# Patient Record
Sex: Male | Born: 1970
Health system: Southern US, Community
[De-identification: ages and names within clinical notes are randomized; demographics above are authoritative.]

## PROBLEM LIST (undated history)

## (undated) DIAGNOSIS — J45909 Unspecified asthma, uncomplicated: Secondary | ICD-10-CM

## (undated) DIAGNOSIS — M1990 Unspecified osteoarthritis, unspecified site: Secondary | ICD-10-CM

## (undated) DIAGNOSIS — I639 Cerebral infarction, unspecified: Secondary | ICD-10-CM

## (undated) HISTORY — DX: Cerebral infarction, unspecified: I63.9

## (undated) HISTORY — DX: Unspecified osteoarthritis, unspecified site: M19.90

## (undated) HISTORY — DX: Unspecified asthma, uncomplicated: J45.909

---

## 1999-01-31 ENCOUNTER — Emergency Department (HOSPITAL_COMMUNITY): Admission: EM | Admit: 1999-01-31 | Discharge: 1999-02-01 | Payer: Self-pay | Admitting: Emergency Medicine

## 1999-02-22 ENCOUNTER — Emergency Department (HOSPITAL_COMMUNITY): Admission: EM | Admit: 1999-02-22 | Discharge: 1999-02-22 | Payer: Self-pay | Admitting: Emergency Medicine

## 1999-07-12 ENCOUNTER — Emergency Department (HOSPITAL_COMMUNITY): Admission: EM | Admit: 1999-07-12 | Discharge: 1999-07-12 | Payer: Self-pay | Admitting: Emergency Medicine

## 1999-07-12 ENCOUNTER — Encounter: Payer: Self-pay | Admitting: Emergency Medicine

## 2000-04-30 ENCOUNTER — Emergency Department (HOSPITAL_COMMUNITY): Admission: EM | Admit: 2000-04-30 | Discharge: 2000-04-30 | Payer: Self-pay | Admitting: *Deleted

## 2000-07-12 ENCOUNTER — Emergency Department (HOSPITAL_COMMUNITY): Admission: EM | Admit: 2000-07-12 | Discharge: 2000-07-12 | Payer: Self-pay | Admitting: Emergency Medicine

## 2003-02-28 ENCOUNTER — Emergency Department (HOSPITAL_COMMUNITY): Admission: EM | Admit: 2003-02-28 | Discharge: 2003-02-28 | Payer: Self-pay | Admitting: Emergency Medicine

## 2004-11-09 ENCOUNTER — Emergency Department (HOSPITAL_COMMUNITY): Admission: EM | Admit: 2004-11-09 | Discharge: 2004-11-09 | Payer: Self-pay | Admitting: Emergency Medicine

## 2004-12-09 ENCOUNTER — Emergency Department (HOSPITAL_COMMUNITY): Admission: EM | Admit: 2004-12-09 | Discharge: 2004-12-10 | Payer: Self-pay | Admitting: Emergency Medicine

## 2005-06-04 ENCOUNTER — Emergency Department (HOSPITAL_COMMUNITY): Admission: EM | Admit: 2005-06-04 | Discharge: 2005-06-04 | Payer: Self-pay | Admitting: Emergency Medicine

## 2006-01-27 ENCOUNTER — Emergency Department (HOSPITAL_COMMUNITY): Admission: EM | Admit: 2006-01-27 | Discharge: 2006-01-27 | Payer: Self-pay | Admitting: Emergency Medicine

## 2009-03-30 ENCOUNTER — Emergency Department (HOSPITAL_COMMUNITY): Admission: EM | Admit: 2009-03-30 | Discharge: 2009-03-30 | Payer: Self-pay | Admitting: Emergency Medicine

## 2010-05-20 LAB — POCT I-STAT, CHEM 8
BUN: 16 mg/dL (ref 6–23)
Calcium, Ion: 1.19 mmol/L (ref 1.12–1.32)
Chloride: 108 mEq/L (ref 96–112)
Creatinine, Ser: 1.3 mg/dL (ref 0.4–1.5)
Glucose, Bld: 94 mg/dL (ref 70–99)
HCT: 44 % (ref 39.0–52.0)
Hemoglobin: 15 g/dL (ref 13.0–17.0)
Potassium: 3.9 mEq/L (ref 3.5–5.1)
Sodium: 140 mEq/L (ref 135–145)
TCO2: 25 mmol/L (ref 0–100)

## 2010-05-20 LAB — POCT CARDIAC MARKERS
CKMB, poc: <1 ng/mL — ABNORMAL LOW (ref 1.0–8.0)
Myoglobin, poc: 67.2 ng/mL (ref 12–200)
Troponin i, poc: <0.05 ng/mL (ref 0.00–0.09)

## 2013-06-17 ENCOUNTER — Emergency Department (HOSPITAL_BASED_OUTPATIENT_CLINIC_OR_DEPARTMENT_OTHER)
Admission: EM | Admit: 2013-06-17 | Discharge: 2013-06-17 | Disposition: A | Discharge status: Home / Self Care | Payer: PRIVATE HEALTH INSURANCE | Attending: Emergency Medicine | Admitting: Emergency Medicine

## 2013-06-17 ENCOUNTER — Encounter (HOSPITAL_BASED_OUTPATIENT_CLINIC_OR_DEPARTMENT_OTHER): Payer: Self-pay | Admitting: Emergency Medicine

## 2013-06-17 DIAGNOSIS — M542 Cervicalgia: Secondary | ICD-10-CM | POA: Insufficient documentation

## 2013-06-17 DIAGNOSIS — F17200 Nicotine dependence, unspecified, uncomplicated: Secondary | ICD-10-CM | POA: Insufficient documentation

## 2013-06-17 DIAGNOSIS — R209 Unspecified disturbances of skin sensation: Secondary | ICD-10-CM | POA: Insufficient documentation

## 2013-06-17 DIAGNOSIS — M545 Low back pain, unspecified: Secondary | ICD-10-CM | POA: Insufficient documentation

## 2013-06-17 DIAGNOSIS — M549 Dorsalgia, unspecified: Secondary | ICD-10-CM

## 2013-06-17 DIAGNOSIS — M6281 Muscle weakness (generalized): Secondary | ICD-10-CM | POA: Insufficient documentation

## 2013-06-17 MED ORDER — HYDROMORPHONE HCL PF 1 MG/ML IJ SOLN
1.0000 mg | Freq: Once | INTRAMUSCULAR | Status: AC
  Administered 2013-06-17: 1 mg via INTRAMUSCULAR
  Filled 2013-06-17: qty 1

## 2013-06-17 MED ORDER — HYDROCODONE-ACETAMINOPHEN 5-325 MG PO TABS: 1.0000 | ORAL_TABLET | Freq: Four times a day (QID) | ORAL | Status: DC | PRN

## 2013-06-17 MED ORDER — DIAZEPAM 5 MG PO TABS
5.0000 mg | ORAL_TABLET | Freq: Once | ORAL | Status: AC
  Administered 2013-06-17: 5 mg via ORAL
  Filled 2013-06-17: qty 1

## 2013-06-17 MED ORDER — KETOROLAC TROMETHAMINE 60 MG/2ML IM SOLN
60.0000 mg | Freq: Once | INTRAMUSCULAR | Status: AC
  Administered 2013-06-17: 60 mg via INTRAMUSCULAR
  Filled 2013-06-17: qty 2

## 2013-06-17 MED ORDER — DIAZEPAM 5 MG PO TABS: 5.0000 mg | ORAL_TABLET | Freq: Three times a day (TID) | ORAL | Status: DC | PRN

## 2013-06-17 NOTE — ED Notes (Signed)
Pt c/o lower back pain x 2 weeks 

## 2013-06-17 NOTE — ED Provider Notes (Signed)
CSN: [id number]     Arrival date & time 06/17/13  1750 History  This chart was scribed for Osvaldo Shipper, MD by Eston Mould, ED Scribe. This patient was seen in room MH01/MH01 and the patient's care was started at 6:17 PM.   Chief Complaint  Patient presents with  . Back Pain   Patient is a 43 y.o. male presenting with back pain. The history is provided by the patient. No language interpreter was used.  Back Pain Location:  Lumbar spine Quality:  Aching, stabbing and cramping Radiates to:  L posterior upper leg Pain severity:  Severe Pain is:  Same all the time Onset quality:  Gradual Duration:  2 weeks Timing:  Intermittent Progression:  Worsening Chronicity:  Recurrent Context: not falling, not lifting heavy objects, not MVA, not occupational injury, not physical stress, not recent illness, not recent injury and not twisting   Relieved by:  Nothing Worsened by:  Nothing tried Associated symptoms: numbness, tingling and weakness (to bilateral hands)   Associated symptoms: no abdominal swelling, no bladder incontinence, no bowel incontinence, no dysuria, no leg pain and no pelvic pain   Risk factors: no lack of exercise    HPI Comments: RYUN VELEZ is a 43 y.o. male with a hx of back pain and Bells palsy who presents to the Emergency Department complaining of ongoing worsening lower back pain x 2 weeks that radiates up his neck with episodes of numbness and tingling. Pt states 2 weeks ago he was having L sided numbness and tingling that was "up and down" bilateral legs and would radiate to his back. He states he began having back spasms that began while at work last week. Pt denies heavy lifting while at work when spasms began. He states his back pain is still present although it is intermittent. He states he woke up with a crick  in his neck this morning but also states for the past 2 weeks, he has had limited neck ROM. He states sitting and resting helps the pain  some but reports having pain returns shortly after standing up. He reports having loss of strength and sensation to his bilateral hands since onset back pain. He was diagnosed with Bells palsy over 10 years ago and states he was unable to move his body and had facial weakness at that point. Hx of back pain due to playing basketball when he was younger. 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  History reviewed. No pertinent past medical history. History reviewed. No pertinent past surgical history. History reviewed. No pertinent family history. History  Substance Use Topics  . Smoking status: Current Every Day Smoker -- 0.50 packs/day    Types: Cigarettes  . Smokeless tobacco: Not on file  . Alcohol Use: No    Review of Systems  Gastrointestinal: Negative for bowel incontinence.  Genitourinary: Negative for bladder incontinence, dysuria and pelvic pain.  Musculoskeletal: Positive for back pain and neck pain. Negative for neck stiffness.  Neurological: Positive for tingling, weakness (to bilateral hands) and numbness.  All other systems reviewed and are negative.  Allergies  Review of patient's allergies indicates no known allergies.  Home Medications   Prior to Admission medications   Not on File   Triage Vitals:BP 112/75  Pulse 80  Temp(Src) 98 F (36.7 C) (Oral)  Resp 18  Ht 6' (1.829 m)  Wt 185 lb (83.915 kg)  BMI 25.08 kg/m2  SpO2 98%  Physical Exam  Nursing note and vitals reviewed. Constitutional: He is oriented to person, place, and time. He appears well-developed and well-nourished. No distress.  HENT:  Head: Normocephalic and atraumatic.  Eyes: EOM are normal.  Neck: Neck supple. No tracheal deviation present.  Cardiovascular: Normal rate.   Pulmonary/Chest: Effort normal. No respiratory distress.  Abdominal: He exhibits no mass. There is no  rebound and no guarding.  Musculoskeletal: Normal range of motion.  L muscular back tenderness. No bony tenderness.   Neurological: He is alert and oriented to person, place, and time. A sensory deficit (mild altered light touch on lower left leg, left arm) is present. He exhibits normal muscle tone. Coordination normal. GCS eye subscore is 4. GCS verbal subscore is 5. GCS motor subscore is 6.  Skin: Skin is warm and dry.  Psychiatric: He has a normal mood and affect. His behavior is normal.   ED Course  Procedures DIAGNOSTIC STUDIES: Oxygen Saturation is 98% on RA, normal by my interpretation.    COORDINATION OF CARE: 6:28 PM-Discussed treatment plan which includes shot of Dilaudid and Vallum  while in the ED and monitor pt. Pt agreed to plan.   7:45 PM-Re-checked pt.   Labs Review Labs Reviewed - No data to display  Imaging Review No results found.   EKG Interpretation None     MDM   Final diagnoses:  Back pain    19M here with back pain. Numbness and tingling radiating down L leg and occasionally L arm. No fever, vomiting, incontinence/retention. No known injury. Hx of similar symptoms once before. All began with spasms in his back. Vitals stable. L paraspinal musculature with tenderness on palpation on exam. No bony spinal tenderness. Mild altered light touch on peripheral L sided extremities. No dermatomal distribution of numbness, no concern for stroke. Chronicity of symptoms and relation to muscle spasm sensation make vascular cause unlikely. Symptoms and physical exam c/w muscle spasm in the back. Pain meds given with no relief. Toradol given after recheck with great improvement. Patient ready for discharge, up getting dressed. Stable for discharge.  I personally performed the services described in this documentation, which was scribed in my presence. The recorded information has been reviewed and is accurate.     Osvaldo Shipper, MD 06/17/13 [phone]

## 2013-06-17 NOTE — Discharge Instructions (Signed)
Back Exercises °Back exercises help treat and prevent back injuries. The goal of back exercises is to increase the strength of your abdominal and back muscles and the flexibility of your back. These exercises should be started when you no longer have back pain. Back exercises include: °· Pelvic Tilt. Lie on your back with your knees bent. Tilt your pelvis until the lower part of your back is against the floor. Hold this position 5 to 10 sec and repeat 5 to 10 times. °· Knee to Chest. Pull first 1 knee up against your chest and hold for 20 to 30 seconds, repeat this with the other knee, and then both knees. This may be done with the other leg straight or bent, whichever feels better. °· Sit-Ups or Curl-Ups. Bend your knees 90 degrees. Start with tilting your pelvis, and do a partial, slow sit-up, lifting your trunk only 30 to 45 degrees off the floor. Take at least 2 to 3 seconds for each sit-up. Do not do sit-ups with your knees out straight. If partial sit-ups are difficult, simply do the above but with only tightening your abdominal muscles and holding it as directed. °· Hip-Lift. Lie on your back with your knees flexed 90 degrees. Push down with your feet and shoulders as you raise your hips a couple inches off the floor; hold for 10 seconds, repeat 5 to 10 times. °· Back arches. Lie on your stomach, propping yourself up on bent elbows. Slowly press on your hands, causing an arch in your low back. Repeat 3 to 5 times. Any initial stiffness and discomfort should lessen with repetition over time. °· Shoulder-Lifts. Lie face down with arms beside your body. Keep hips and torso pressed to floor as you slowly lift your head and shoulders off the floor. °Do not overdo your exercises, especially in the beginning. Exercises may cause you some mild back discomfort which lasts for a few minutes; however, if the pain is more severe, or lasts for more than 15 minutes, do not continue exercises until you see your caregiver.  Improvement with exercise therapy for back problems is slow.  °See your caregivers for assistance with developing a proper back exercise program. °Document Released: 03/28/2004 Document Revised: 05/13/2011 Document Reviewed: 12/20/2010 °ExitCare® Patient Information ©2014 [employer]. ° °Back Pain, Adult °Low back pain is very common. About 1 in 5 people have back pain. The cause of low back pain is rarely dangerous. The pain often gets better over time. About half of people with a sudden onset of back pain feel better in just 2 weeks. About 8 in 10 people feel better by 6 weeks.  °CAUSES °Some common causes of back pain include: °· Strain of the muscles or ligaments supporting the spine. °· Wear and tear (degeneration) of the spinal discs. °· Arthritis. °· Direct injury to the back. °DIAGNOSIS °Most of the time, the direct cause of low back pain is not known. However, back pain can be treated effectively even when the exact cause of the pain is unknown. Answering your caregiver's questions about your overall health and symptoms is one of the most accurate ways to make sure the cause of your pain is not dangerous. If your caregiver needs more information, he or she may order lab work or imaging tests (X-rays or MRIs). However, even if imaging tests show changes in your back, this usually does not require surgery. °HOME CARE INSTRUCTIONS °For many people, back pain returns. Since low back pain is rarely dangerous, it is often a condition that people   can learn to [facility] their own.   Remain active. It is stressful on the back to sit or stand in one place. Do not sit, drive, or stand in one place for more than 30 minutes at a time. Take short walks on level surfaces as soon as pain allows.Try to increase the length of time you walk each day.  Do not stay in bed.Resting more than 1 or 2 days can delay your recovery.  Do not avoid exercise or work.Your body is made to move.It is not dangerous to be active,  even though your back may hurt.Your back will likely heal faster if you return to being active before your pain is gone.  Pay attention to your body when you bend and lift. Many people have less discomfortwhen lifting if they bend their knees, keep the load close to their bodies,and avoid twisting. Often, the most comfortable positions are those that put less stress on your recovering back.  Find a comfortable position to sleep. Use a firm mattress and lie on your side with your knees slightly bent. If you lie on your back, put a pillow under your knees.  Only take over-the-counter or prescription medicines as directed by your caregiver. Over-the-counter medicines to reduce pain and inflammation are often the most helpful.Your caregiver may prescribe muscle relaxant drugs.These medicines help dull your pain so you can more quickly return to your normal activities and healthy exercise.  Put ice on the injured area.  Put ice in a plastic bag.  Place a towel between your skin and the bag.  Leave the ice on for 15-20 minutes, 03-04 times a day for the first 2 to 3 days. After that, ice and heat may be alternated to reduce pain and spasms.  Ask your caregiver about trying back exercises and gentle massage. This may be of some benefit.  Avoid feeling anxious or stressed.Stress increases muscle tension and can worsen back pain.It is important to recognize when you are anxious or stressed and learn ways to manage it.Exercise is a great option. SEEK MEDICAL CARE IF:  You have pain that is not relieved with rest or medicine.  You have pain that does not improve in 1 week.  You have new symptoms.  You are generally not feeling well. SEEK IMMEDIATE MEDICAL CARE IF:   You have pain that radiates from your back into your legs.  You develop new bowel or bladder control problems.  You have unusual weakness or numbness in your arms or legs.  You develop nausea or vomiting.  You develop  abdominal pain.  You feel faint. Document Released: 02/18/2005 Document Revised: 08/20/2011 Document Reviewed: 07/09/2010 [facility] Patient Information 2014 [place], [place].   Emergency Department Resource Guide 1) Find a Doctor and Pay Out of Pocket Although you won't have to find out who is covered by your insurance plan, it is a good idea to ask around and get recommendations. You will then need to call the office and see if the doctor you have chosen will accept you as a new patient and what types of options they offer for patients who are self-pay. Some doctors offer discounts or will set up payment plans for their patients who do not have insurance, but you will need to ask so you aren't surprised when you get to your appointment.  2) Contact Your Local Health Department Not all health departments have doctors that can see patients for sick visits, but many do, so it is worth a call to  see if yours does. If you don't know where your local health department is, you can check in your phone book. The CDC also has a tool to help you locate your state's health department, and many state websites also have listings of all of their local health departments.  3) Find a [facility] If your illness is not likely to be very severe or complicated, you may want to try a walk in clinic. These are popping up all over the country in pharmacies, drugstores, and shopping centers. They're usually staffed by nurse practitioners or physician assistants that have been trained to treat common illnesses and complaints. They're usually fairly quick and inexpensive. However, if you have serious medical issues or chronic medical problems, these are probably not your best option.  No Primary Care Doctor: - Call Health Connect at  [phone] - they can help you locate a primary care doctor that  accepts your insurance, provides certain services, etc. - Physician Referral Service- [phone]  Chronic Pain  Problems: Organization         Address  Phone   Notes  [facility]  [phone] Patients need to be referred by their primary care doctor.   Medication Assistance: Organization         Address  Phone   Notes  [facility] Medication [facility] [address] [place], [place] [address] [phone] --Must be a resident of [facility] -- Must have NO insurance coverage whatsoever (no Medicaid/ Medicare, etc.) -- The pt. MUST have a primary care doctor that directs their care regularly and follows them in the community   MedAssist  [phone]   [employer]  9186419916    Agencies that provide inexpensive medical care: Organization         Address  Phone   Notes  [place]  [phone]   [facility]    [phone]   [facility] [place], [place] [address] [phone]   [place]crete [address], [place] [phone]   Planned Parenthood    [phone]   [facility]    [phone]   [place] [address], [place] Phone:  [phone], Fax:  [phone] Hours of Operation:  9 am - 6 pm, M-F.  Also accepts Medicaid/Medicare and self-pay.  [facility] for [place] [address], [address], [place] Phone: [phone], Fax: [phone]. Hours of Operation:  8:30 am - 5:30 pm, M-F.  Also accepts Medicaid and self-pay.  [facility] [place] [address], [place] Phone: [phone]   [place], [place], [place] [phone], Ext. [phone] Mondays & Thursdays: 7-9 AM.  First 15 patients are seen on a first come, first serve basis.    [place] Providers:  Organization         Address  Phone   Notes  [facility] [address], [place] [phone] Also  accepts self-pay patients.  [place], [place]  [phone]   [place], [address], [place] [phone]   [place] [address] [place] [phone]   Lucianne Lei [address], [place]   [phone] Only accepts [place] Access Medicaid patients after they have their name applied to their card.   Self-Pay (no insurance) in [facility] [place]:  Organization         Address  Phone   Notes  Sickle Cell Patients, [facility] [address], [place] [phone]) [phone]   [facility] [address], [place] [phone]   [facility] [place]  [address], [place] [phone]   [facility]/Dr. Osei-Bonsu  [address] [place] [address], [place] or [phone], [place] [phone]) [phone] Phone number for both [place] and [place] locations is the same.  [facility] [address], [place] [phone]) [phone]   [facility] [place] [address] [place] [address], [place] or [address] [phone]) [phone]   [facility] [address], [place] [phone]) [phone], phone; [phone]) [phone], fax Sees patients 1st and 3rd Saturday of every month.  Must not qualify for public or private insurance (i.e. Medicaid, Medicare, [place] Health Choice, Veterans' Benefits)  Household income should be no more than 200% of the poverty level The clinic cannot treat you if you are pregnant or think you are pregnant  Sexually transmitted diseases are not treated at the clinic.    Dental Care: Organization         Address  Phone  Notes  [facility] [address], [place] [phone]) [phone] Accepts children up to age 421 who are enrolled in [place] or [place] Health Choice; pregnant  women with a Medicaid card; and children who have applied for Medicaid or [place] Health Choice, but were declined, whose parents can pay a reduced fee at time of service.  [facility] of [facility] [place]  [address], [place] [phone]) [phone] Accepts children up to age 41 who are enrolled in [place] or [place] Health Choice; pregnant women with a Medicaid card; and children who have applied for Medicaid or [place] Health Choice, but were declined, whose parents can pay a reduced fee at time of service.  [facility]  [address], [place] [phone]) [phone] Patients are seen by appointment only. Walk-ins are not accepted. [facility] will see patients 41 years of age and older. Monday - Tuesday (8am-5pm) Most Wednesdays (8:30-5pm) $30 per visit, cash only  [facility]  [address], [place] [phone] Patients are seen by appointment only. Walk-ins are not accepted. [facility] will see patients 41 years of age and older. One Wednesday Evening (Monthly: Volunteer Based).  $30 per visit, cash only  [employer] [facility]  [phone]) [phone] for adults; Children under age 224, call Graduate Pediatric Dentistry at [phone]. Children aged 134-14, please call [phone] to request a pediatric application.  Dental services are provided in all areas of dental care including fillings, crowns and bridges, complete and partial dentures, implants, gum treatment, root canals, and extractions. Preventive care is also provided. Treatment is provided to both adults and children. Patients are selected via a lottery and there is often a waiting list.   [facility] [address], [place]  [phone] [email or web address]   [facility] [address], [place], [place] [phone], Ext. [phone] Second and Fourth Thursday of each month, opens at 6:30 AM;  Clinic ends at 9 AM.  Patients are  seen on a first-come first-served basis, and a limited number are seen during each clinic.   [facility]  [address] Hillard Danker [place], [place] [phone]   Eligibility Requirements You must have lived in [place], [place], or [place] counties for at least the last three months.   You cannot be eligible for state or federal sponsored [employer], including [employer], [place], or [employer].   You generally cannot be eligible for healthcare insurance through your employer.    How to apply: Eligibility screenings are held every Tuesday and Wednesday afternoon from 1:00 pm until 4:00 pm. You do not need an appointment for the interview!  [facility] [address] [place] [address], [place], [place]   [place]  [place] Department  [place]  [phone]    Behavioral Health Resources in the Community: Intensive Outpatient Programs Organization         Address  Phone  Notes  [place]. [address], [place], [place] [phone]   [facility]patient [address], [place], [place]   ADS: Alcohol & Drug Svcs [address], [place], [place]   [place] [address]  [place], [place] or [phone]   Substance Abuse Resources Organization         Address  Phone  Notes  Alcohol and Drug Services  6237738663   [place]ian  [phone]   The [place]   Chinita Pester  [phone]   Residential & Outpatient Substance Abuse Program  [phone]   Psychological Services Organization         Address  Phone  Notes  [facility] [place]  [place]  [phone]   [place] [address] [place] or [phone]    Mobile Crisis  Teams Organization         Address  Phone  Notes  [facility], Mobile Crisis Care Unit  [phone]   [facility]ive Psychotherapeutic Services  [address] [place], [place]   [place] Levels [address] [place] [phone]    Self-Help/Support Groups Organization         Address  Phone             Notes  [place]. of [place] - variety of support groups  [place] Call for more information  Narcotics Anonymous (NA), [facility] [address], [employer] [place]  2 meetings at this location   Special educational needs teacher         Address  Phone  Notes  [facility]AP Residential Treatment [place],    [place]  [phone]   [facility]  [address] [place] [address], [place], [place]   [place], [place] [phone] Admissions: 8am-3pm M-F  Incentives Substance [place] [address]    [place], [place] [phone]   [facility] [address] Jadene Pierini [place], [place]   [facility] [address]  [place], [place]   [facility] Programs - Intensive Outpatient [address] Kristeen [address], [place], [place]   [facility] ([place] [address] [place], [place] or [phone]  [facility]idential [facility] Services (RTS) [address] [place], [place] [phone] Accepts Medicaid  [facility] [address]  [place] [phone] Substance Abuse/Addiction Treatment   [facility] Behavioral Health Resources Organization         Address  Phone  Notes  [facility]  [phone]) [phone]   Angie FavaJulie Brannon, PhD [address], [address] [place], [place]   [phone] or [phone]) [phone]   [facility]   [address] [place], [place] [phone](336) [phone]   [facility] [address], [place], [place] [phone]) [phone]  Insurance/Medicaid/sponsorship through [facility]  Faith and Families [address]                                    [place], [place] [phone]) [phone] Therapy/tele-psych/case  [facility] [address].   [place], [place] [phone]) [phone]    Dr. Lolly MustacheArfeen  [phone](336) [phone]   [facility]  [employer] [facility] 1) [address], [place] 2) [address], [place] 3)  [address], [place] [phone]) [phone]) [phone]  [phone]) [phone]   [facility] Child Abuse Hotline [phone]) [phone] or [phone]) [phone] (After Hours)

## 2015-02-21 ENCOUNTER — Encounter (HOSPITAL_BASED_OUTPATIENT_CLINIC_OR_DEPARTMENT_OTHER): Payer: Self-pay | Admitting: *Deleted

## 2015-02-21 ENCOUNTER — Emergency Department (HOSPITAL_BASED_OUTPATIENT_CLINIC_OR_DEPARTMENT_OTHER): Payer: 59

## 2015-02-21 ENCOUNTER — Emergency Department (HOSPITAL_BASED_OUTPATIENT_CLINIC_OR_DEPARTMENT_OTHER)
Admission: EM | Admit: 2015-02-21 | Discharge: 2015-02-21 | Disposition: A | Discharge status: Home / Self Care | Payer: 59 | Attending: Emergency Medicine | Admitting: Emergency Medicine

## 2015-02-21 DIAGNOSIS — R51 Headache: Secondary | ICD-10-CM | POA: Insufficient documentation

## 2015-02-21 DIAGNOSIS — F17210 Nicotine dependence, cigarettes, uncomplicated: Secondary | ICD-10-CM | POA: Diagnosis not present

## 2015-02-21 DIAGNOSIS — M542 Cervicalgia: Secondary | ICD-10-CM | POA: Insufficient documentation

## 2015-02-21 DIAGNOSIS — M546 Pain in thoracic spine: Secondary | ICD-10-CM | POA: Diagnosis not present

## 2015-02-21 MED ORDER — CYCLOBENZAPRINE HCL 10 MG PO TABS: 10.0000 mg | ORAL_TABLET | Freq: Three times a day (TID) | ORAL | Status: DC | PRN

## 2015-02-21 MED ORDER — KETOROLAC TROMETHAMINE 60 MG/2ML IM SOLN
60.0000 mg | Freq: Once | INTRAMUSCULAR | Status: AC
  Administered 2015-02-21: 60 mg via INTRAMUSCULAR
  Filled 2015-02-21: qty 2

## 2015-02-21 MED ORDER — NAPROXEN 500 MG PO TABS: 500.0000 mg | ORAL_TABLET | Freq: Two times a day (BID) | ORAL | Status: DC

## 2015-02-21 NOTE — ED Notes (Addendum)
Left shoulder, arm, leg and head pain for 6 months to a year. He was diagnosed with nerve pain and given a steroid injection in his neck April 2015.

## 2015-02-21 NOTE — ED Notes (Signed)
Patient transported to radiology department via stretcher.

## 2015-02-21 NOTE — ED Provider Notes (Signed)
CSN: [id number]     Arrival date & time 02/21/15  1515 History   First MD Initiated Contact with Patient 02/21/15 1541     Chief Complaint  Patient presents with  . Muscle Pain     (Consider location/radiation/quality/duration/timing/severity/associated sxs/prior Treatment) HPI  44 year old male presents with a worsening headache over the past 1 week. Patient states that in April 2015 he had similar symptoms and was given a localize shot in his left neck/head that seem to make his symptoms go away. However he is still had left-sided back and neck pain as well as pain over his trapezius. This has been going on for "months" but also is worse with the headache being worse. There was no sudden onset of pain. No fevers. He feels like his arm and leg are weak although he later explains this is hard to move due to pain. No numbness. No direct injury or trauma. Patient has not taken anything for the pain. He states the pain is "200 times worse" than when he was here in April. He specifically does not want another shot like the one he had. Patient has not followed up with a PCP. No bowel/bladder incontinence. Ever having is worse with range of motion of his neck or movement of the left side of his body.  History reviewed. No pertinent past medical history. History reviewed. No pertinent past surgical history. No family history on file. Social History  Substance Use Topics  . Smoking status: Current Every Day Smoker -- 0.50 packs/day    Types: Cigarettes  . Smokeless tobacco: None  . Alcohol Use: No    Review of Systems  Constitutional: Negative for fever.  Gastrointestinal: Negative for vomiting.  Musculoskeletal: Positive for myalgias and neck pain.  Neurological: Positive for headaches. Negative for weakness.  All other systems reviewed and are negative.     Allergies  Review of patient's allergies indicates no known allergies.  Home Medications   Prior to Admission medications     Medication Sig Start Date End Date Taking? Authorizing Provider  diazepam (VALIUM) 5 MG tablet Take 1 tablet (5 mg total) by mouth every 8 (eight) hours as needed for muscle spasms. 06/17/13   Evelina Bucy, MD  HYDROcodone-acetaminophen (NORCO/VICODIN) 5-325 MG per tablet Take 1 tablet by mouth every 6 (six) hours as needed for moderate pain. 06/17/13   Evelina Bucy, MD   BP 124/93 mmHg  Pulse 81  Temp(Src) 98.3 F (36.8 C) (Oral)  Resp 20  Ht 5\' 11"  (1.803 m)  Wt 185 lb (83.915 kg)  BMI 25.81 kg/m2  SpO2 100% Physical Exam  Constitutional: He is oriented to person, place, and time. He appears well-developed and well-nourished.  HENT:  Head: Normocephalic and atraumatic.  Right Ear: External ear normal.  Left Ear: External ear normal.  Nose: Nose normal.  Eyes: EOM are normal. Right eye exhibits no discharge. Left eye exhibits no discharge.  Neck: Neck supple. Muscular tenderness present. No spinous process tenderness present.    Cardiovascular: Normal rate, regular rhythm, normal heart sounds and intact distal pulses.   Pulmonary/Chest: Effort normal and breath sounds normal.  Abdominal: Soft. There is no tenderness.  Musculoskeletal: He exhibits no edema.       Cervical back: He exhibits tenderness.       Back:  Neurological: He is alert and oriented to person, place, and time.  CN 2-12 grossly intact. 5/5 strength in all 4 extremities. Grossly normal sensation.  Skin: Skin is warm and dry.  Nursing note and vitals reviewed.   ED Course  Procedures (including critical care time) Labs Review Labs Reviewed - No data to display  Imaging Review Dg Cervical Spine Complete  02/21/2015  CLINICAL DATA:  Cervical pain radiating from occipital region down to right upper extremity with numbness several years. Pain worse today. EXAM: CERVICAL SPINE - COMPLETE 4+ VIEW COMPARISON:  05/10/2014 FINDINGS: Vertebral body alignment, heights and disc space heights are within normal. There  is mild spondylosis of the cervical spine. Prevertebral soft tissues as well as the atlantoaxial articulation are normal. There is minimal right-sided neural foramina narrowing at the C3-4 level slightly worse. No left-sided neural foraminal narrowing. Mild uncovertebral joint spurring. IMPRESSION: Mild spondylosis of the cervical spine. Minimal right-sided neural foraminal narrowing at the C3-4 level slightly worse. Electronically Signed   By: Marin Olp M.D.   On: 02/21/2015 17:02   I have personally reviewed and evaluated these images and lab results as part of my medical decision-making.   EKG Interpretation None      MDM   Final diagnoses:  Neck pain on left side  Left-sided thoracic back pain    Patient's headache seems to be coming from his neck and back pain. I believe these are from a muscle spasm. Will treat with Flexeril and NSAIDs. No alarming findings on history or physical. Very low suspicion for significant pathology intracranially do not feel CT or lumbar fracture would be indicated. This appears to be mostly an MSK issue. No weakness or numbness on exam. Will refer to a PCP, orthopedist, and discussed return precautions.    Sherwood Gambler, MD 02/21/15 [phone]

## 2015-03-02 ENCOUNTER — Ambulatory Visit (INDEPENDENT_AMBULATORY_CARE_PROVIDER_SITE_OTHER): Payer: 59 | Admitting: Family Medicine

## 2015-03-02 ENCOUNTER — Encounter: Payer: Self-pay | Admitting: Family Medicine

## 2015-03-02 VITALS — BP 142/100 | HR 92 | Ht 72.0 in | Wt 185.0 lb

## 2015-03-02 DIAGNOSIS — R52 Pain, unspecified: Secondary | ICD-10-CM

## 2015-03-02 MED ORDER — PREDNISONE 10 MG PO TABS
ORAL_TABLET | ORAL | Status: DC
Start: 2015-03-02 — End: 2015-07-05

## 2015-03-02 NOTE — Patient Instructions (Signed)
There are three possibilities here: irritated spinal cord at the level of the neck, diffuse muscle spasms, or a neurologic issue. Start with prednisone dose pack x 6 days - take fully until this is gone. Call us when you know what the muscle relaxant is you were talking about. When you get the insurance cards bring these in so we can order physical therapy. Follow up with me 4 weeks after starting physical therapy - if not improving would consider an MRI of your cervical spine or neurology referral.

## 2015-03-03 DIAGNOSIS — R52 Pain, unspecified: Secondary | ICD-10-CM | POA: Insufficient documentation

## 2015-03-03 NOTE — Progress Notes (Signed)
PCP: No PCP Per Patient  Subjective:   HPI: Patient is a 44 y.o. male here for left side pain.  Patient reports current issues with left side started 06/02/2013 - was having low back pain radiating to neck with numbness and tingling predominantly on left side. Had a local injection (toradol per ED) that helped for about a month but has been struggling with pain since then. Pain level is 10/10 and throbbing mainly in neck now. Worse with neck motions. He states he has also had a cold chill, blurred vision when this started and could barely move left arm and leg. Left side feels weak as well both in arm and leg. Numbness into left foot currently. Taking flexeril, naproxen. No skin changes, fever.  No past medical history on file.  Current Outpatient Prescriptions on File Prior to Visit  Medication Sig Dispense Refill  . cyclobenzaprine (FLEXERIL) 10 MG tablet Take 1 tablet (10 mg total) by mouth 3 (three) times daily as needed for muscle spasms. 20 tablet 0  . HYDROcodone-acetaminophen (NORCO/VICODIN) 5-325 MG per tablet Take 1 tablet by mouth every 6 (six) hours as needed for moderate pain. 20 tablet 0  . naproxen (NAPROSYN) 500 MG tablet Take 1 tablet (500 mg total) by mouth 2 (two) times daily. 30 tablet 0   No current facility-administered medications on file prior to visit.    No past surgical history on file.  No Known Allergies  Social History   Social History  . Marital Status: Single    Spouse Name: N/A  . Number of Children: N/A  . Years of Education: N/A   Occupational History  . Not on file.   Social History Main Topics  . Smoking status: Current Every Day Smoker -- 0.50 packs/day    Types: Cigarettes  . Smokeless tobacco: Not on file  . Alcohol Use: No  . Drug Use: No  . Sexual Activity: No   Other Topics Concern  . Not on file   Social History Narrative    No family history on file.  BP 142/100 mmHg  Pulse 92  Ht 6' (1.829 m)  Wt 185 lb (83.915  kg)  BMI 25.08 kg/m2  Review of Systems: See HPI above.    Objective:  Physical Exam:  Gen: NAD  Neck: No gross deformity, swelling, bruising. TTP left cervical and thoracic paraspinal region.  No midline/bony TTP. FROM. BUE strength 5/5.   2+ equal reflexes in triceps, biceps, brachioradialis tendons. Negative spurlings. Decreased sensation entire left arm.  Back: No gross deformity, scoliosis. TTP .  No midline or bony TTP. FROM. Strength RLEs 5/5 all muscle groups, 4/5 LLE.   2+ MSRs in achilles tendons, trace patellar tendons, equal bilaterally. Negative SLRs. Sensation diminished LLE to light touch. Negative logroll bilateral hips Negative fabers and piriformis stretches.    Assessment & Plan:  1. Left side pain - Unusual presentation though has been present for over 1 1/2 years making intracranial abnormality less likely.  Irritation cervical spinal cord more on left side, diffuse muscle spasms possible as well.  Discussed options - will start prednisone dose pack, muscle relaxant.  Would like to add physical therapy.  Consider cervical spine MRI, neuro referral if not improving.

## 2015-03-03 NOTE — Assessment & Plan Note (Signed)
Unusual presentation though has been present for over 1 1/2 years making intracranial abnormality less likely.  Irritation cervical spinal cord more on left side, diffuse muscle spasms possible as well.  Discussed options - will start prednisone dose pack, muscle relaxant.  Would like to add physical therapy.  Consider cervical spine MRI, neuro referral if not improving.

## 2015-07-05 ENCOUNTER — Encounter (HOSPITAL_BASED_OUTPATIENT_CLINIC_OR_DEPARTMENT_OTHER): Payer: Self-pay

## 2015-07-05 ENCOUNTER — Emergency Department (HOSPITAL_BASED_OUTPATIENT_CLINIC_OR_DEPARTMENT_OTHER)
Admission: EM | Admit: 2015-07-05 | Discharge: 2015-07-05 | Disposition: A | Discharge status: Home / Self Care | Payer: 59 | Attending: Emergency Medicine | Admitting: Emergency Medicine

## 2015-07-05 DIAGNOSIS — M5416 Radiculopathy, lumbar region: Secondary | ICD-10-CM | POA: Insufficient documentation

## 2015-07-05 DIAGNOSIS — F17210 Nicotine dependence, cigarettes, uncomplicated: Secondary | ICD-10-CM | POA: Diagnosis not present

## 2015-07-05 DIAGNOSIS — M545 Low back pain: Secondary | ICD-10-CM | POA: Diagnosis present

## 2015-07-05 DIAGNOSIS — M5410 Radiculopathy, site unspecified: Secondary | ICD-10-CM

## 2015-07-05 MED ORDER — IBUPROFEN 800 MG PO TABS
800.0000 mg | ORAL_TABLET | Freq: Once | ORAL | Status: AC
Start: 2015-07-05 — End: 2015-07-05
  Administered 2015-07-05: 800 mg via ORAL
  Filled 2015-07-05: qty 1

## 2015-07-05 MED ORDER — METHOCARBAMOL 500 MG PO TABS: 500.0000 mg | ORAL_TABLET | Freq: Two times a day (BID) | ORAL | Status: DC

## 2015-07-05 MED ORDER — IBUPROFEN 800 MG PO TABS: 800.0000 mg | ORAL_TABLET | Freq: Three times a day (TID) | ORAL | Status: DC | PRN

## 2015-07-05 NOTE — Discharge Instructions (Signed)

## 2015-07-05 NOTE — ED Notes (Signed)
C/o lower back pain since moving a washer last week-slow steady gait-NAD

## 2015-07-05 NOTE — ED Provider Notes (Signed)
CSN: [id number]     Arrival date & time 07/05/15  1839 History   First MD Initiated Contact with Patient 07/05/15 2114     Chief Complaint  Patient presents with  . Back Pain     (Consider location/radiation/quality/duration/timing/severity/associated sxs/prior Treatment) HPI   45 year old male presents for evaluation of lower back pain.  Pt c/o progressive worsening lower back pain x 1 week.  Has been seen in the past for this chronic back pain.  Pain is lower radiates to neck, constant, worsening after trying to move a washer by himself.  Has tried exercise without relief.  He tried picking up fiberoptic cords today which worsening the pain.  Report difficulty walking in his leg and moving his neck.  Worsening pain with trying to bend down.  In the past he was prescribed naprosen which doesn't help.  Has had injection which doesn't help.  States he has been seen and management by orthopedist in the past but sts he doesn't receive a definitely diagnosis.  Denies fever, chills, weakness in extremities, bowel/bladder incontinence or saddle anesthesia.  NO hematuria or melena.  History reviewed. No pertinent past medical history. History reviewed. No pertinent past surgical history. No family history on file. Social History  Substance Use Topics  . Smoking status: Current Every Day Smoker -- 0.50 packs/day    Types: Cigarettes  . Smokeless tobacco: None  . Alcohol Use: Yes     Comment: weekly    Review of Systems  All other systems reviewed and are negative.     Allergies  Review of patient's allergies indicates no known allergies.  Home Medications   Prior to Admission medications   Not on File   BP 118/71 mmHg  Pulse 80  Temp(Src) 98.7 F (37.1 C) (Oral)  Ht 5\' 11"  (1.803 m)  Wt 83.915 kg  BMI 25.81 kg/m2  SpO2 100% Physical Exam  Constitutional: He is oriented to person, place, and time. He appears well-developed and well-nourished. No distress.  HENT:  Head:  Atraumatic.  Eyes: Conjunctivae are normal.  Neck: Neck supple.  Cardiovascular: Intact distal pulses.   Abdominal: Soft. There is no tenderness.  Musculoskeletal: He exhibits tenderness (diffused tenderness throughout the entire back with gentle palpation without focal point tenderness.  no overlying skin changes.). He exhibits no edema.  Poor effort but equal strength to all 4 extremities  Neurological: He is alert and oriented to person, place, and time. He has normal reflexes.  Skin: No rash noted.  Psychiatric: He has a normal mood and affect.  Nursing note and vitals reviewed.   ED Course  Procedures (including critical care time)   MDM   Final diagnoses:  Radicular low back pain    BP 118/71 mmHg  Pulse 80  Temp(Src) 98.7 F (37.1 C) (Oral)  Ht 5\' 11"  (1.803 m)  Wt 83.915 kg  BMI 25.81 kg/m2  SpO2 100%   Pt here with acute on chronic back pain with radiculopathy.  No specific trauma, low suspicion for fx/dislocation.  No red flags.  Will provide NSAIDs and muscle relaxant to use as needed.  F/u with ortho PRN.  Return precaution discussed.  Pt able to ambulate.   Domenic Moras, PA-C 07/05/15 [place], MD 07/07/15 [phone]

## 2016-02-08 ENCOUNTER — Encounter (HOSPITAL_BASED_OUTPATIENT_CLINIC_OR_DEPARTMENT_OTHER): Payer: Self-pay | Admitting: *Deleted

## 2016-02-08 ENCOUNTER — Emergency Department (HOSPITAL_BASED_OUTPATIENT_CLINIC_OR_DEPARTMENT_OTHER)
Admission: EM | Admit: 2016-02-08 | Discharge: 2016-02-09 | Discharge status: Against Medical Advice | Payer: 59 | Attending: Emergency Medicine | Admitting: Emergency Medicine

## 2016-02-08 DIAGNOSIS — R200 Anesthesia of skin: Secondary | ICD-10-CM

## 2016-02-08 DIAGNOSIS — R519 Headache, unspecified: Secondary | ICD-10-CM

## 2016-02-08 DIAGNOSIS — R531 Weakness: Secondary | ICD-10-CM | POA: Insufficient documentation

## 2016-02-08 DIAGNOSIS — R51 Headache: Secondary | ICD-10-CM | POA: Insufficient documentation

## 2016-02-08 DIAGNOSIS — F17210 Nicotine dependence, cigarettes, uncomplicated: Secondary | ICD-10-CM | POA: Insufficient documentation

## 2016-02-08 NOTE — ED Triage Notes (Signed)
Pt c/o sudden onset of left arm and leg numbness  X 12 hrs, denies slurred speech.

## 2016-02-09 MED ORDER — HYDROCODONE-ACETAMINOPHEN 5-325 MG PO TABS: 1.0000 | ORAL_TABLET | Freq: Four times a day (QID) | ORAL | 0 refills | Status: DC | PRN

## 2016-02-09 MED ORDER — HYDROCODONE-ACETAMINOPHEN 5-325 MG PO TABS
2.0000 | ORAL_TABLET | Freq: Once | ORAL | Status: AC
  Administered 2016-02-09: 2 via ORAL
  Filled 2016-02-09: qty 2

## 2016-02-09 NOTE — ED Provider Notes (Signed)
TIME SEEN: 12:20 AM  CHIEF COMPLAINT: Left-sided headache, left-sided numbness and weakness  HPI: Patient is a 45 year old male with history of tobacco use, family history of CAD and strokes who presents to the emergency department with complaints of left-sided posterior headache that radiates down his left neck that started around 11 AM yesterday morning. States at that time also had left-sided numbness and focal weakness in the arm and leg which seems to be improving. No changes in his vision or speech. No head injury. States he had similar symptoms in December and was given an injection in the posterior scalp which helped his pain. States he thinks that he had the neurologic deficits at that time but per the note patient was neurologically intact. He is not on anticoagulation or antiplatelets. No chest pain or shortness of breath. He denies a history of TIA, CVA, intracranial hemorrhage.  No fever, neck stiffness.  ROS: See HPI Constitutional: no fever  Eyes: no drainage  ENT: no runny nose   Cardiovascular:  no chest pain  Resp: no SOB  GI: no vomiting GU: no dysuria Integumentary: no rash  Allergy: no hives  Musculoskeletal: no leg swelling  Neurological: no slurred speech ROS otherwise negative  PAST MEDICAL HISTORY/PAST SURGICAL HISTORY:  History reviewed. No pertinent past medical history.  MEDICATIONS:  Prior to Admission medications   Not on File    ALLERGIES:  No Known Allergies  SOCIAL HISTORY:  Social History  Substance Use Topics  . Smoking status: Current Every Day Smoker    Packs/day: 0.50    Types: Cigarettes  . Smokeless tobacco: Not on file  . Alcohol use Yes     Comment: weekly    FAMILY HISTORY: History reviewed. No pertinent family history.  EXAM: BP 150/95   Pulse 81   Temp 98.2 F (36.8 C) (Oral)   Resp 18   Ht 6' (1.829 m)   Wt 185 lb (83.9 kg)   SpO2 100%   BMI 25.09 kg/m  CONSTITUTIONAL: Alert and oriented and responds appropriately  to questions. Well-appearing; well-nourished, Appears anxious and tearful HEAD: Normocephalic EYES: Conjunctivae clear, PERRL, EOMI ENT: normal nose; no rhinorrhea; moist mucous membranes NECK: Supple, no meningismus, no nuchal rigidity, no LAD, no carotid bruit appreciated, tender over the trapezius muscle and over the left occiput without swelling, erythema or warmth  CARD: RRR; S1 and S2 appreciated; no murmurs, no clicks, no rubs, no gallops RESP: Normal chest excursion without splinting or tachypnea; breath sounds clear and equal bilaterally; no wheezes, no rhonchi, no rales, no hypoxia or respiratory distress, speaking full sentences ABD/GI: Normal bowel sounds; non-distended; soft, non-tender, no rebound, no guarding, no peritoneal signs, no hepatosplenomegaly BACK:  The back appears normal and is non-tender to palpation, there is no CVA tenderness EXT: Normal ROM in all joints; non-tender to palpation; no edema; normal capillary refill; no cyanosis, no calf tenderness or swelling    SKIN: Normal color for age and race; warm; no rash NEURO: Moves all extremities equally but do seem to have some difficulty lifting the left leg, and reports decreased sensation in the left arm and leg but normal sensation in the face on the left side, normal sensation on the right side, decreased strength in the left upper and lower extremity 4-/5 compared to the right which is 5+/5. Normal gait. Normal speech. Cranial nerves II through XII intact. PSYCH: The patient's mood and manner are appropriate. Grooming and personal hygiene are appropriate.  MEDICAL DECISION MAKING: Patient here  with concerning symptoms for stroke. Has left arm and leg numbness and weakness. NIH stroke scale is a 2.  Symptoms started over 12 hours ago. He reports he feels like they are getting better. I have recommended labs, EKG and a head CT. Patient reports that he has his father's funeral in the morning and [place] and he cannot  stay for a workup. Is requesting something for pain. I doubt this is an intracranial hemorrhage but have urged him to allow [place] to perform head CT, labs and EKG in the emergency department and he refuses. States at this time he only wants something for pain as he has to make it to the funeral tomorrow. Have discussed at length with him and his wife that I am concerned about his symptoms and I feel he needs admission to the hospital for a stroke workup. He states that he will plan to go to the closest emergency department after his father's funeral tomorrow. Have discussed with him that he is not a TPA candidate given length of symptoms. I discussed with him risks of leaving without further workup and admission including worsening symptoms, severe and permanent disability, even death. He is able to verbalize these risks back to me and does not appear intoxicated. He appears to have capacity to make these decisions. We will have him sign out [place]. Have offered him another injection in his occiput since this has helped in the past versus oral medication. He would like oral medication. Have given him outpatient PCP, neurology follow-up and again have urged him to follow-up with the closest emergency department as soon as possible. Discussed at length return precautions. Patient and wife are comfortable with this plan.   We discussed the nature and purpose, risks and benefits, as well as, the alternatives of treatment. Time was given to allow the opportunity to ask questions and consider their options, and after the discussion, the patient decided to refuse the offerred treatment. The patient was informed that refusal could lead to, but was not limited to, death, permanent disability, or severe pain. If present, I asked the relatives or significant others to dissuade them without success. Prior to refusing, I determined that the patient had the capacity to make their decision and understood the  consequences of that decision. After refusal, I made every reasonable opportunity to treat them to the best of my ability.   I reviewed all nursing notes, vitals, pertinent old records, EKGs, labs, imaging (as available).     [place], DO 02/09/16 [phone]

## 2016-02-09 NOTE — ED Notes (Signed)
ED Provider at bedside. 

## 2016-02-09 NOTE — Discharge Instructions (Signed)
I'm concerned that you have had a stroke. I recommend that you return to the closest emergency department as soon as possible for further workup. You have declined further workup in the emergency department at this time. We have discussed risks with you including worsening symptoms, significant and permanent disability and even death. I have given outpatient follow-up as well for neurology and a primary care physician.    To find a primary care or specialty doctor please call [phone] or [phone] to access "[facility] a Doctor Service."  You may also go on the [facility] website at [email or web address]  There are also multiple [facility], [facility], [facility] and [facility] practices throughout the [place] that are frequently accepting new patients. You may find a clinic that is close to your home and contact them.  [place] [phone] [place]  [place] [address] [place] [phone]

## 2016-02-11 ENCOUNTER — Emergency Department (HOSPITAL_COMMUNITY): Payer: Self-pay

## 2016-02-11 ENCOUNTER — Other Ambulatory Visit (HOSPITAL_COMMUNITY): Payer: 59

## 2016-02-11 ENCOUNTER — Emergency Department (HOSPITAL_COMMUNITY)
Admission: EM | Admit: 2016-02-11 | Discharge: 2016-02-11 | Disposition: A | Discharge status: Home / Self Care | Payer: Self-pay | Attending: Emergency Medicine | Admitting: Emergency Medicine

## 2016-02-11 ENCOUNTER — Encounter (HOSPITAL_COMMUNITY): Payer: Self-pay | Admitting: *Deleted

## 2016-02-11 DIAGNOSIS — F17210 Nicotine dependence, cigarettes, uncomplicated: Secondary | ICD-10-CM | POA: Insufficient documentation

## 2016-02-11 DIAGNOSIS — Z79899 Other long term (current) drug therapy: Secondary | ICD-10-CM | POA: Insufficient documentation

## 2016-02-11 DIAGNOSIS — R791 Abnormal coagulation profile: Secondary | ICD-10-CM | POA: Insufficient documentation

## 2016-02-11 DIAGNOSIS — M5020 Other cervical disc displacement, unspecified cervical region: Secondary | ICD-10-CM | POA: Insufficient documentation

## 2016-02-11 LAB — COMPREHENSIVE METABOLIC PANEL
ALK PHOS: 49 U/L (ref 38–126)
ALT: 14 U/L — AB (ref 17–63)
ANION GAP: 7 (ref 5–15)
AST: 19 U/L (ref 15–41)
Albumin: 4.3 g/dL (ref 3.5–5.0)
BILIRUBIN TOTAL: 0.8 mg/dL (ref 0.3–1.2)
BUN: 8 mg/dL (ref 6–20)
CALCIUM: 9.5 mg/dL (ref 8.9–10.3)
CO2: 26 mmol/L (ref 22–32)
CREATININE: 1.02 mg/dL (ref 0.61–1.24)
Chloride: 106 mmol/L (ref 101–111)
GFR calc non Af Amer: >60 mL/min (ref >60)
GLUCOSE: 92 mg/dL (ref 65–99)
Potassium: 3.6 mmol/L (ref 3.5–5.1)
Sodium: 139 mmol/L (ref 135–145)
TOTAL PROTEIN: 7.4 g/dL (ref 6.5–8.1)

## 2016-02-11 LAB — RAPID URINE DRUG SCREEN, HOSP PERFORMED
AMPHETAMINES: NOT DETECTED
Barbiturates: NOT DETECTED
Benzodiazepines: NOT DETECTED
Cocaine: NOT DETECTED
OPIATES: POSITIVE — AB
TETRAHYDROCANNABINOL: POSITIVE — AB

## 2016-02-11 LAB — I-STAT TROPONIN, ED: Troponin i, poc: 0 ng/mL (ref 0.00–0.08)

## 2016-02-11 LAB — DIFFERENTIAL
Basophils Absolute: 0 10*3/uL (ref 0.0–0.1)
Basophils Relative: 0 %
EOS PCT: 1 %
Eosinophils Absolute: 0.1 10*3/uL (ref 0.0–0.7)
LYMPHS ABS: 2.5 10*3/uL (ref 0.7–4.0)
LYMPHS PCT: 31 %
MONO ABS: 0.7 10*3/uL (ref 0.1–1.0)
MONOS PCT: 8 %
NEUTROS ABS: 4.7 10*3/uL (ref 1.7–7.7)
Neutrophils Relative %: 60 %

## 2016-02-11 LAB — URINALYSIS, ROUTINE W REFLEX MICROSCOPIC
BACTERIA UA: NONE SEEN
Bilirubin Urine: NEGATIVE
GLUCOSE, UA: NEGATIVE mg/dL
HGB URINE DIPSTICK: NEGATIVE
Ketones, ur: 20 mg/dL — AB
LEUKOCYTES UA: NEGATIVE
NITRITE: NEGATIVE
PH: 5 (ref 5.0–8.0)
Protein, ur: 30 mg/dL — AB
Specific Gravity, Urine: 1.03 (ref 1.005–1.030)

## 2016-02-11 LAB — APTT: aPTT: 31 seconds (ref 24–36)

## 2016-02-11 LAB — PROTIME-INR
INR: 1.06
PROTHROMBIN TIME: 13.8 s (ref 11.4–15.2)

## 2016-02-11 LAB — CBC
HEMATOCRIT: 44.4 % (ref 39.0–52.0)
HEMOGLOBIN: 14.7 g/dL (ref 13.0–17.0)
MCH: 29.1 pg (ref 26.0–34.0)
MCHC: 33.1 g/dL (ref 30.0–36.0)
MCV: 87.7 fL (ref 78.0–100.0)
Platelets: 211 10*3/uL (ref 150–400)
RBC: 5.06 MIL/uL (ref 4.22–5.81)
RDW: 14.3 % (ref 11.5–15.5)
WBC: 8 10*3/uL (ref 4.0–10.5)

## 2016-02-11 LAB — ETHANOL: Alcohol, Ethyl (B): <5 mg/dL (ref <5)

## 2016-02-11 MED ORDER — PREDNISONE 20 MG PO TABS: 20.0000 mg | ORAL_TABLET | Freq: Two times a day (BID) | ORAL | 0 refills | Status: DC

## 2016-02-11 MED ORDER — PREDNISONE 20 MG PO TABS
60.0000 mg | ORAL_TABLET | Freq: Once | ORAL | Status: AC
  Administered 2016-02-11: 60 mg via ORAL
  Filled 2016-02-11: qty 3

## 2016-02-11 MED ORDER — KETOROLAC TROMETHAMINE 30 MG/ML IJ SOLN
30.0000 mg | Freq: Once | INTRAMUSCULAR | Status: AC
  Administered 2016-02-11: 30 mg via INTRAVENOUS
  Filled 2016-02-11: qty 1

## 2016-02-11 NOTE — ED Notes (Signed)
Patient still off the floor for scan. 

## 2016-02-11 NOTE — ED Provider Notes (Signed)
[facility] DEPT Provider Note   CSN: [id number] Arrival date & time: 02/11/16  [id number]     History   Chief Complaint Chief Complaint  Patient presents with  . Headache    HPI Victor Floyd is a 45 y.o. male with a PMH of tobacco use presenting to the ED with left sided posterior headache since Friday. He was seen in the ED on 12/7. At the time, he was having headache, left sided weakness and left sided numbness. The weakness and numbness lasted 1 hour and went away on its own. The ED physician recommended CT head and admission for stroke work-up. Pt declined labs/imaging and admission because he had to go to his father's funeral the next day. He left the ED AMA.  Three days ago while at work, he had sudden onset L posterior headache that felt like "someone hit him in the back of the head with a baseball bat". Since then, he has had constant "throbbing" pain. The pain has been getting progressively worse since then. The pain started when he was pulling cables. The pain starts behind his left ear and radiates down to the left shoulder. The pain is worse with moving his head to the right. He states he had numbness in the area between his left ear and left shoulder. He states that walking caused his neck to hurt more. He denies any dysarthria, changes in vision, photophobia, or phonophobia. He states that he "feels some type of way" before the headache starts, but he cannot describe this feeling beyond that. He denies any head trauma.  Of note, patient has had left sided headache on and off for years. One year ago, he was having numbness of the left side of his face. Per patient report, a doctor "stuck a needle in the back of his head to numb the nerve". After this injection, he pain went from a "100/100 to a 30/100". He has had 30/100 pain for the last year. He was also seen by Sports Medicine 1 year ago, who thought he had irritation of the cervical spinal cord. He was started on a prednisone  dose pack and muscle relaxant, which didn't help much.  HPI  History reviewed. No pertinent past medical history.  Patient Active Problem List   Diagnosis Date Noted  . Pain of left side of body 03/03/2015    History reviewed. No pertinent surgical history.   Home Medications    Prior to Admission medications   Medication Sig Start Date End Date Taking? Authorizing Provider  acetaminophen (TYLENOL) 500 MG tablet Take 1,000 mg by mouth every 6 (six) hours as needed for mild pain.   Yes Historical Provider, MD  HYDROcodone-acetaminophen (NORCO/VICODIN) 5-325 MG tablet Take 1-2 tablets by mouth every 6 (six) hours as needed. 02/09/16  Yes Kristen N Ward, DO  predniSONE (DELTASONE) 20 MG tablet Take 1 tablet (20 mg total) by mouth 2 (two) times daily with a meal. 02/11/16   Sela Hua, MD    Family History Father- passed away this week from a heart attack and stroke Brother- MI at age 17 Mother- MI in her 38s  Social History Social History  Substance Use Topics  . Smoking status: Current Every Day Smoker    Packs/day: 0.50    Types: Cigarettes  . Smokeless tobacco: Not on file  . Alcohol use Yes     Comment: weekly     Allergies   Patient has no known allergies.   Review of Systems  Review of Systems 10 Systems reviewed and are negative for acute change except as noted in the HPI.  Physical Exam Updated Vital Signs BP 128/95   Pulse 75   Temp 98.5 F (36.9 C) (Oral)   Resp 13   SpO2 100%   Physical Exam  Constitutional: He is oriented to person, place, and time. He appears well-developed and well-nourished. No distress.  HENT:  Head: Normocephalic and atraumatic.  Mouth/Throat: Oropharynx is clear and moist.  Eyes: Conjunctivae and EOM are normal. Pupils are equal, round, and reactive to light.  Neck: Normal range of motion. Neck supple.  Cardiovascular: Normal rate and regular rhythm.   No murmur heard. Pulmonary/Chest: Effort normal and breath sounds  normal. No respiratory distress.  Abdominal: Soft. Bowel sounds are normal. He exhibits no distension. There is no tenderness. There is no rebound and no guarding.  Musculoskeletal: Normal range of motion. He exhibits no edema, tenderness or deformity.  Paraspinal muscles of the cervical spine are tender to palpation, pain intensifies with shoulder shrug and turning head to the right.  Lymphadenopathy:    He has no cervical adenopathy.  Neurological: He is alert and oriented to person, place, and time. He displays normal reflexes. No cranial nerve deficit or sensory deficit. He exhibits normal muscle tone. Coordination normal.  Skin: Skin is warm and dry.  Psychiatric: He has a normal mood and affect. His behavior is normal. Thought content normal.  Nursing note and vitals reviewed.   ED Treatments / Results  Labs (all labs ordered are listed, but only abnormal results are displayed) Labs Reviewed  COMPREHENSIVE METABOLIC PANEL - Abnormal; Notable for the following:       Result Value   ALT 14 (*)    All other components within normal limits  RAPID URINE DRUG SCREEN, HOSP PERFORMED - Abnormal; Notable for the following:    Opiates POSITIVE (*)    Tetrahydrocannabinol POSITIVE (*)    All other components within normal limits  URINALYSIS, ROUTINE W REFLEX MICROSCOPIC - Abnormal; Notable for the following:    APPearance HAZY (*)    Ketones, ur 20 (*)    Protein, ur 30 (*)    Squamous Epithelial / LPF 0-5 (*)    Crystals PRESENT (*)    All other components within normal limits  ETHANOL  PROTIME-INR  APTT  CBC  DIFFERENTIAL  I-STAT TROPOININ, ED    EKG  EKG Interpretation None       Radiology Mr Cervical Spine Wo Contrast  Result Date: 02/11/2016 CLINICAL DATA:  Headache.  Neck pain and left arm pain EXAM: MRI CERVICAL SPINE WITHOUT CONTRAST TECHNIQUE: Multiplanar, multisequence MR imaging of the cervical spine was performed. No intravenous contrast was administered.  COMPARISON:  Cervical radiographs 02/21/2015 FINDINGS: Alignment: Normal alignment.  Mild kyphosis at C3-4 Vertebrae: Negative for fracture or mass. Cord: Normal signal and morphology Posterior Fossa, vertebral arteries, paraspinal tissues: normal paraspinous soft tissues. Brainstem normal. Mucosal edema paranasal sinuses. Disc levels: C2-3:  Negative C3-4: Small central disc protrusion. Cord flattening with mild spinal stenosis. Neural foramina patent. C4-5:  Negative C5-6:  Negative C6-7:  Negative C7-T1:  Negative IMPRESSION: Small central disc protrusion at C3-4 causing mild spinal stenosis. No significant foraminal narrowing. Other disc spaces appear normal. Electronically Signed   By: Franchot Gallo M.D.   On: 02/11/2016 12:52    Procedures Procedures (including critical care time)  Medications Ordered in ED Medications  predniSONE (DELTASONE) tablet 60 mg (60 mg Oral Given 02/11/16  1136)  ketorolac (TORADOL) 30 MG/ML injection 30 mg (30 mg Intravenous Given 02/11/16 1135)    Initial Impression / Assessment and Plan / ED Course  I have reviewed the triage vital signs and the nursing notes.  Pertinent labs & imaging results that were available during my care of the patient were reviewed by me and considered in my medical decision making (see chart for details).  Clinical Course    Ramsey is a 45 year old male with a PMH of tobacco use presenting to the ED with left posterior "throbbing" headache. He was seen in the ED on 12/7 and it was recommended that he be admitted and having head imaging performed. He left AMA because he had to attend his father's funeral. His history and exam are consistent with musculoskeletal etiology. It appears that most of his pain is in his neck and not his head. His pain started when he was pulling cables. His pain is worse with moving his head. He states that the only numbness and tingling he ever had was located between his neck and shoulder on the left side.  The paraspinal muscles of his cervical spine and his left trapezius muscle are tender to palpation. His neuro exam is completely normal without focal deficits. This seems to have been going on for years, making intracranial pathology less likely. Another consideration is migraine, with his unilateral "throbbing" pain, although he denies photophobia, nausea, vomiting. Will treat with Toradol 30mg  IV and Prednisone 50mg . Will also order MRI cervical spine.  11:15AM: Initial labs WNL.res  1:55PM: MRI cervical spine with small central disc protrusion at C3-C4 causing mild spinal stenosis. No significant foraminal narrowing. Discussed that we will treat with a Prednisone dose pack. Pt can take Ibuprofen 600mg  q6hrs prn after that. Provided number for PCP. Pt may benefit from orthopedic surgery referral vs PT referral if his pain is not getting better.  Final Clinical Impressions(s) / ED Diagnoses   Final diagnoses:  Protrusion of cervical intervertebral disc    New Prescriptions New Prescriptions   PREDNISONE (DELTASONE) 20 MG TABLET    Take 1 tablet (20 mg total) by mouth 2 (two) times daily with a meal.     Sela Hua, MD 02/11/16 [place], MD 02/11/16 1810

## 2016-02-11 NOTE — ED Notes (Signed)
Patient still off floor for scan.

## 2016-02-11 NOTE — ED Notes (Signed)
Resident at bedside.  

## 2016-02-11 NOTE — ED Notes (Signed)
EKG given to Dr. Wentz 

## 2016-02-11 NOTE — ED Notes (Signed)
Walked pt to and from bathroom, no issues. Pt back on monitor.

## 2016-02-11 NOTE — ED Triage Notes (Signed)
Pt reports being seen at [facility] on Friday for posterior headache and weakness to left arm. Pt was told to come here to r/o stroke but was unable to come until today due to family death. Pt reports still having headache and pain that radiates down into left arm, states that the weakness has resolved.

## 2016-02-11 NOTE — Discharge Instructions (Signed)
We did an MRI of your neck, which showed a disc protrusion. This may be what is causing your pain. We think you would benefit from having a primary care doctor. You should talk with your new doctor about referral to a physical therapist or to an orthopedic surgeon if your pain does not get better.   We have prescribed Prednisone. Please take 1 tablet twice a day for the next 5 days. You can take Ibuprofen and Tylenol as needed after that. You should also put some heat on your neck and shoulders to help with the muscle tightness.

## 2017-12-09 ENCOUNTER — Other Ambulatory Visit: Payer: Self-pay

## 2017-12-09 ENCOUNTER — Emergency Department (HOSPITAL_BASED_OUTPATIENT_CLINIC_OR_DEPARTMENT_OTHER): Payer: 59

## 2017-12-09 ENCOUNTER — Encounter (HOSPITAL_BASED_OUTPATIENT_CLINIC_OR_DEPARTMENT_OTHER): Payer: Self-pay | Admitting: *Deleted

## 2017-12-09 ENCOUNTER — Emergency Department (HOSPITAL_BASED_OUTPATIENT_CLINIC_OR_DEPARTMENT_OTHER)
Admission: EM | Admit: 2017-12-09 | Discharge: 2017-12-09 | Disposition: A | Discharge status: Home / Self Care | Payer: 59 | Attending: Emergency Medicine | Admitting: Emergency Medicine

## 2017-12-09 DIAGNOSIS — M25532 Pain in left wrist: Secondary | ICD-10-CM | POA: Diagnosis not present

## 2017-12-09 DIAGNOSIS — F17210 Nicotine dependence, cigarettes, uncomplicated: Secondary | ICD-10-CM | POA: Diagnosis not present

## 2017-12-09 MED ORDER — NAPROXEN 500 MG PO TABS: 500.0000 mg | ORAL_TABLET | Freq: Two times a day (BID) | ORAL | 0 refills | Status: DC

## 2017-12-09 NOTE — Discharge Instructions (Signed)
Please read and follow all provided instructions.  Your diagnoses today include:  1. Left wrist pain     Tests performed today include:  An x-ray of the affected area - does NOT show any broken bones  Vital signs. See below for your results today.   Medications prescribed:   Naproxen - anti-inflammatory pain medication  Do not exceed 500mg  naproxen every 12 hours, take with food  You have been prescribed an anti-inflammatory medication or NSAID. Take with food. Take smallest effective dose for the shortest duration needed for your pain. Stop taking if you experience stomach pain or vomiting.   Take any prescribed medications only as directed.  Home care instructions:   Follow any educational materials contained in this packet  Follow R.I.C.E. Protocol:  R - rest your injury   I  - use ice on injury without applying directly to skin  C - compress injury with bandage or splint  E - elevate the injury as much as possible  Follow-up instructions: Please follow-up with your primary care provider or the provided orthopedic physician (bone specialist) if you continue to have significant pain in 1 week. In this case you may have a more severe injury that requires further care.   Return instructions:   Please return if your fingers are numb or tingling, appear gray or blue, or you have severe pain (also elevate the arm and loosen splint or wrap if you were given one)  Please return to the Emergency Department if you experience worsening symptoms.   Please return if you have any other emergent concerns.  Additional Information:  Your vital signs today were: BP 125/90 (BP Location: Right Arm)    Pulse 68    Temp 98.4 F (36.9 C) (Oral)    Resp 18    Ht 6' (1.829 m)    Wt 83.9 kg    SpO2 98%    BMI 25.09 kg/m  If your blood pressure (BP) was elevated above 135/85 this visit, please have this repeated by your doctor within one month. --------------

## 2017-12-09 NOTE — ED Triage Notes (Signed)
Left wrist pain x 2 weeks. He thinks he sustained an injury but can't remember how.

## 2017-12-09 NOTE — ED Provider Notes (Signed)
[facility] EMERGENCY DEPARTMENT Provider Note   CSN: [id number] Arrival date & time: 12/09/17  1133     History   Chief Complaint Chief Complaint  Patient presents with  . Wrist Injury    HPI Victor Floyd is a 47 y.o. male.  Patient presents to the emergency department with complaint of left wrist pain over the past 2 weeks.  Patient thinks that he "tweaked" in the wrist while he was mowing grass about 2 weeks ago.  This is about when he started having pain.  He denies any falls or accidents or other more severe injuries.  He continues to use the wrist and helped his mother move recently and also cut grass again yesterday.  His symptoms got much worse after this prompting emergency department visit today.  Pain is worse when he moves his thumb and bends his wrist.  No treatments prior to arrival.  No numbness or tingling.  Denies other injuries or complaints.  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     History reviewed. No pertinent past medical history.  Patient Active Problem List   Diagnosis Date Noted  . Pain of left side of body 03/03/2015    History reviewed. No pertinent surgical history.      Home Medications    Prior to Admission medications   Medication Sig Start Date End Date Taking? Authorizing Provider  acetaminophen (TYLENOL) 500 MG tablet Take 1,000 mg by mouth every 6 (six) hours as needed for mild pain.    [provider]  HYDROcodone-acetaminophen (NORCO/VICODIN) 5-325 MG tablet Take 1-2 tablets by mouth every 6 (six) hours as needed. 02/09/16   Ward, Delice Bison, DO  predniSONE (DELTASONE) 20 MG tablet Take 1 tablet (20 mg total) by mouth 2 (two) times daily with a meal. 02/11/16   Mayo, Pete Pelt, MD    Family History No family history on file.  Social History Social History   Tobacco Use  . Smoking status: Current Every Day Smoker    Packs/day: 0.50    Types: Cigarettes  . Smokeless  tobacco: Never Used  Substance Use Topics  . Alcohol use: Yes    Comment: weekly  . Drug use: Yes    Types: Marijuana     Allergies   Patient has no known allergies.   Review of Systems Review of Systems  Constitutional: Negative for activity change.  Musculoskeletal: Positive for arthralgias. Negative for back pain, gait problem, joint swelling and neck pain.  Skin: Negative for wound.  Neurological: Negative for weakness and numbness.     Physical Exam Updated Vital Signs BP 125/90 (BP Location: Right Arm)   Pulse 68   Temp 98.4 F (36.9 C) (Oral)   Resp 18   Ht 6' (1.829 m)   Wt 83.9 kg   SpO2 98%   BMI 25.09 kg/m   Physical Exam  Constitutional: He appears well-developed and well-nourished.  HENT:  Head: Normocephalic and atraumatic.  Eyes: Conjunctivae are normal.  Neck: Normal range of motion. Neck supple.  Cardiovascular:  Pulses:      Radial pulses are 2+ on the left side.  Pulmonary/Chest: No respiratory distress.  Musculoskeletal:       Left shoulder: Normal.       Left elbow: Normal.       Left wrist: He exhibits decreased range of motion and tenderness (Tenderness over the distal radius.). He exhibits no bony tenderness.  Left hand: Normal.  Neurological: He is alert.  Skin: Skin is warm and dry.  Psychiatric: He has a normal mood and affect.  Nursing note and vitals reviewed.    ED Treatments / Results  Labs (all labs ordered are listed, but only abnormal results are displayed) Labs Reviewed - No data to display  EKG None  Radiology Dg Wrist Complete Left  Result Date: 12/09/2017 CLINICAL DATA:  Left wrist pain for 2 weeks, no known injury, initial encounter EXAM: LEFT WRIST - COMPLETE 3+ VIEW COMPARISON:  None. FINDINGS: There is no evidence of fracture or dislocation. There is no evidence of arthropathy or other focal bone abnormality. Soft tissues are unremarkable. IMPRESSION: No acute abnormality noted. Electronically Signed    By: Inez Catalina M.D.   On: 12/09/2017 12:16    Procedures Procedures (including critical care time)  Medications Ordered in ED Medications - No data to display   Initial Impression / Assessment and Plan / ED Course  I have reviewed the triage vital signs and the nursing notes.  Pertinent labs & imaging results that were available during my care of the patient were reviewed by me and considered in my medical decision making (see chart for details).     Patient seen and examined.  X-ray reviewed.  Vital signs reviewed and are as follows: BP 125/90 (BP Location: Right Arm)   Pulse 68   Temp 98.4 F (36.9 C) (Oral)   Resp 18   Ht 6' (1.829 m)   Wt 83.9 kg   SpO2 98%   BMI 25.09 kg/m   12:46 PM Imaging neg. Velcro wrist splint ordered.   Patient was counseled on RICE protocol and told to rest injury, use ice for no longer than 15 minutes every hour, compress the area, and elevate above the level of their heart as much as possible to reduce swelling. Questions answered. Patient verbalized understanding.    Sports med f/u given.   Final Clinical Impressions(s) / ED Diagnoses   Final diagnoses:  Left wrist pain   Patient with wrist pain, no distinct injury.  Imaging is negative for fracture.  Rice protocol indicated at current time with orthopedic follow-up.  Upper extremity and hand are neurovascularly intact.   ED Discharge Orders         Ordered    naproxen (NAPROSYN) 500 MG tablet  2 times daily     12/09/17 1247           Carlisle Cater, PA-C 12/09/17 1248    Gareth Morgan, MD 12/09/17 [id number]

## 2017-12-16 ENCOUNTER — Ambulatory Visit (INDEPENDENT_AMBULATORY_CARE_PROVIDER_SITE_OTHER): Payer: 59 | Admitting: Family Medicine

## 2017-12-16 ENCOUNTER — Ambulatory Visit (HOSPITAL_BASED_OUTPATIENT_CLINIC_OR_DEPARTMENT_OTHER)
Admission: RE | Admit: 2017-12-16 | Discharge: 2017-12-16 | Disposition: A | Discharge status: Home / Self Care | Payer: 59 | Source: Ambulatory Visit | Attending: Family Medicine | Admitting: Family Medicine

## 2017-12-16 ENCOUNTER — Encounter: Payer: Self-pay | Admitting: Family Medicine

## 2017-12-16 VITALS — BP 123/85 | HR 71 | Ht 72.0 in | Wt 185.0 lb

## 2017-12-16 DIAGNOSIS — M25532 Pain in left wrist: Secondary | ICD-10-CM | POA: Insufficient documentation

## 2017-12-16 DIAGNOSIS — S6992XA Unspecified injury of left wrist, hand and finger(s), initial encounter: Secondary | ICD-10-CM

## 2017-12-16 DIAGNOSIS — X58XXXA Exposure to other specified factors, initial encounter: Secondary | ICD-10-CM | POA: Diagnosis not present

## 2017-12-16 NOTE — Patient Instructions (Addendum)
Wear the thumb spica brace at all times except to wash the area. We will go ahead with an MRI to further assess for possible occult scaphoid fracture. Ibuprofen 600mg  three times a day with food for pain and inflammation. Tylenol 500mg  1-2 tabs as needed three times a day. Topical capsaicin, aspercreme up to 4 times a day may be helpful. Follow up will depend on the MRI results.

## 2017-12-16 NOTE — Progress Notes (Signed)
   HPI  CC: Left wrist pain  The patient presents today 3 weeks after onset of left wrist pain. He reports that pain came on suddenly when he turned the steering wheel of his lawn mower. Subsequently he was moving furniture and felt that the pain worsened. He was seen and evaluated in the emergency department on 10/8 and XR of his wrist was negative at that time. He was placed in a wrist splint and asked to follow up with sports medicine.  Today, patient reports stiffness and persistent pain in the wrist joint, over the scaphoid and at the base of the first metacarpal. Pain does not radiate. He has been taking ibuprofen to help with the pain. He has been wearing the splint every day and throughout the night. He is concerned about his ability to go back to work because he has an upcoming work trip to [place] planned.  No skin changes or numbness.  He denies previous injury or surgery to the left wrist.    ROS: Per HPI; in addition no fever, no rash, no additional weakness, no additional numbness, no additional paresthesias, and no additional falls/injury.   Objective: BP 123/85   Pulse 71   Ht 6' (1.829 m)   Wt 185 lb (83.9 kg)   BMI 25.09 kg/m  Gen: NAD, well groomed, a/o x3, normal affect.  CV: Well-perfused. Warm.  Resp: Non-labored.  Neuro: Sensation intact throughout. No gross coordination deficits.  Gait: Nonpathologic posture, unremarkable stride without signs of limp or balance issues.  Wrist, left: TTP noted at the base of the first metacarpal and over the scaphoid, disatl radius. Inspection yielded no erythema, ecchymosis, bony deformity, or swelling. ROM limited in flexion, extension and ulnar/radial deviation in setting of recent splint use.  Palpation is normal over metacarpals; tendons without tenderness/swelling. Strength 4.5/5 in all directions.  5/5 with thumb opposition, finger extension and abduction.  NVI distally.  Right wrist: No deformity. FROM with 5/5  strength. No tenderness to palpation. NVI distally.  Assessment and Plan:  Left wrist pain - considered scaphoid fracture, scapholunate ligamentous injury, vs Chauffeur fracture given the mechanism of injury. Repeat plain films were independently reviewed today and negative. - switch to thumb spica brace - continue PRN ibuprofen for discomfort, tylenol if needed, topical medications discussed. - MRI for occult scaphoid fracture, scapholunate ligamentous injury - further plan based on MRI results  Orders Placed This Encounter  Procedures  . DG Wrist Complete Left    Standing Status:   Future    Number of Occurrences:   1    Standing Expiration Date:   02/16/2019    Order Specific Question:   Reason for Exam (SYMPTOM  OR DIAGNOSIS REQUIRED)    Answer:   include clenched fist view please; wrist injury with pain over snuffbox, distal radius    Order Specific Question:   Preferred imaging location?    Answer:   [facility] Specific Question:   Radiology Contrast Protocol - do NOT remove file path    Answer:   [email or web address]    No orders of the defined types were placed in this encounter.   Everrett Coombe, MD PGY-3 [facility]

## 2017-12-17 NOTE — Addendum Note (Signed)
Addended by: Sherrie George F on: 12/17/2017 08:18 AM   Modules accepted: Orders

## 2017-12-18 ENCOUNTER — Other Ambulatory Visit: Payer: 59

## 2017-12-19 ENCOUNTER — Encounter: Payer: Self-pay | Admitting: Family Medicine

## 2017-12-19 ENCOUNTER — Ambulatory Visit (INDEPENDENT_AMBULATORY_CARE_PROVIDER_SITE_OTHER): Payer: 59 | Admitting: Family Medicine

## 2017-12-19 VITALS — BP 119/83 | HR 73 | Ht 72.0 in | Wt 185.0 lb

## 2017-12-19 DIAGNOSIS — S6992XD Unspecified injury of left wrist, hand and finger(s), subsequent encounter: Secondary | ICD-10-CM | POA: Diagnosis not present

## 2017-12-19 NOTE — Progress Notes (Signed)
   HPI  CC:  Left wrist pain, follow up  Patient follows up 3 days after initial evaluation for left wrist pain from an injury on his lawn mower. Wrist XR at previous visit were negative and patient was sent for MRI to rule out occult scaphoid fracture or scapholunate ligamentous injury. He was placed in thumb spica brace (previously had been in wrist brace).  Today, patient presents to the office again. He reports that his symptoms of pain at the base of the first metacarpal and decreased range of motion due to pain have significantly improved over the past 3 days. He reports that he did not go for the MRI because he was unable to afford this. Additionally he wants to get back to work as soon as possible.   ROS: Per HPI; in addition no fever, no rash, no additional weakness, no additional numbness, no additional paresthesias, and no additional falls/injury.  Objective: BP 119/83   Pulse 73   Ht 6' (1.829 m)   Wt 185 lb (83.9 kg)   BMI 25.09 kg/m  Gen: NAD, well groomed, a/o x3, normal affect.  CV: Well-perfused. Warm.  Resp: Non-labored.  Neuro: Sensation intact throughout.  Left Wrist: Mild tenderness to palpation at the base of the first metacarpal, mild tenderness over distal radius, minimal tenderness snuffbox. No erythema or swelling. No bony deformity. Full ROM in flexion, extension and ulnar/radial deviation with 5/5 strength in all directions. Neurovascularly intact distally.  Assessment and Plan: Left wrist pain - symptoms and exam have improved since previous. Patient has only been in the thumb spica brace for 3 days, and he wonders whether the other brace may have been exacerbating his symptoms. He is very motivated to go back to work but agrees to keep the brace on while he is working. - hold off on MRI for now - continue thumb spica brace - follow up in 2-4 weeks for follow up exam but call sooner if pain worsening - work note provided.  Everrett Coombe, MD PGY-3 [facility]

## 2017-12-19 NOTE — Patient Instructions (Signed)
Wear the thumb spica brace regularly as you have been. Icing, tylenol, ibuprofen if needed. Follow up with me in 2-4 weeks as we discussed - ideally when you're no longer tender but if pain is severe I need to see you back.

## 2017-12-21 ENCOUNTER — Encounter: Payer: Self-pay | Admitting: Family Medicine

## 2018-04-27 ENCOUNTER — Inpatient Hospital Stay (HOSPITAL_BASED_OUTPATIENT_CLINIC_OR_DEPARTMENT_OTHER)
Admission: EM | Admit: 2018-04-27 | Discharge: 2018-04-29 | DRG: 190 | Disposition: A | Discharge status: Home / Self Care | Payer: 59 | Attending: Family Medicine | Admitting: Family Medicine

## 2018-04-27 ENCOUNTER — Other Ambulatory Visit: Payer: Self-pay

## 2018-04-27 ENCOUNTER — Emergency Department (HOSPITAL_BASED_OUTPATIENT_CLINIC_OR_DEPARTMENT_OTHER): Payer: 59

## 2018-04-27 ENCOUNTER — Encounter (HOSPITAL_BASED_OUTPATIENT_CLINIC_OR_DEPARTMENT_OTHER): Payer: Self-pay | Admitting: *Deleted

## 2018-04-27 DIAGNOSIS — J441 Chronic obstructive pulmonary disease with (acute) exacerbation: Secondary | ICD-10-CM | POA: Diagnosis not present

## 2018-04-27 DIAGNOSIS — J181 Lobar pneumonia, unspecified organism: Secondary | ICD-10-CM | POA: Diagnosis not present

## 2018-04-27 DIAGNOSIS — B9789 Other viral agents as the cause of diseases classified elsewhere: Secondary | ICD-10-CM | POA: Diagnosis present

## 2018-04-27 DIAGNOSIS — Z720 Tobacco use: Secondary | ICD-10-CM | POA: Diagnosis present

## 2018-04-27 DIAGNOSIS — J440 Chronic obstructive pulmonary disease with acute lower respiratory infection: Principal | ICD-10-CM | POA: Diagnosis present

## 2018-04-27 DIAGNOSIS — F17210 Nicotine dependence, cigarettes, uncomplicated: Secondary | ICD-10-CM | POA: Diagnosis present

## 2018-04-27 DIAGNOSIS — Z8249 Family history of ischemic heart disease and other diseases of the circulatory system: Secondary | ICD-10-CM | POA: Diagnosis not present

## 2018-04-27 DIAGNOSIS — F17290 Nicotine dependence, other tobacco product, uncomplicated: Secondary | ICD-10-CM | POA: Diagnosis present

## 2018-04-27 DIAGNOSIS — R0602 Shortness of breath: Secondary | ICD-10-CM | POA: Diagnosis not present

## 2018-04-27 DIAGNOSIS — I517 Cardiomegaly: Secondary | ICD-10-CM | POA: Diagnosis present

## 2018-04-27 DIAGNOSIS — J189 Pneumonia, unspecified organism: Secondary | ICD-10-CM | POA: Diagnosis present

## 2018-04-27 DIAGNOSIS — R05 Cough: Secondary | ICD-10-CM | POA: Diagnosis not present

## 2018-04-27 DIAGNOSIS — J4541 Moderate persistent asthma with (acute) exacerbation: Secondary | ICD-10-CM | POA: Diagnosis not present

## 2018-04-27 DIAGNOSIS — R059 Cough, unspecified: Secondary | ICD-10-CM

## 2018-04-27 LAB — LACTIC ACID, PLASMA: Lactic Acid, Venous: 0.9 mmol/L (ref 0.5–1.9)

## 2018-04-27 LAB — CBC WITH DIFFERENTIAL/PLATELET
Abs Immature Granulocytes: 0.08 10*3/uL — ABNORMAL HIGH (ref 0.00–0.07)
BASOS PCT: 0 %
Basophils Absolute: 0.1 10*3/uL (ref 0.0–0.1)
EOS ABS: 0.4 10*3/uL (ref 0.0–0.5)
Eosinophils Relative: 2 %
HCT: 44.7 % (ref 39.0–52.0)
Hemoglobin: 14.5 g/dL (ref 13.0–17.0)
Immature Granulocytes: 1 %
Lymphocytes Relative: 14 %
Lymphs Abs: 2 10*3/uL (ref 0.7–4.0)
MCH: 28.8 pg (ref 26.0–34.0)
MCHC: 32.4 g/dL (ref 30.0–36.0)
MCV: 88.7 fL (ref 80.0–100.0)
MONO ABS: 1.3 10*3/uL — AB (ref 0.1–1.0)
MONOS PCT: 9 %
NEUTROS ABS: 10.7 10*3/uL — AB (ref 1.7–7.7)
NEUTROS PCT: 74 %
Platelets: 268 10*3/uL (ref 150–400)
RBC: 5.04 MIL/uL (ref 4.22–5.81)
RDW: 14.2 % (ref 11.5–15.5)
Smear Review: NORMAL
WBC: 14.5 10*3/uL — AB (ref 4.0–10.5)
nRBC: 0 % (ref 0.0–0.2)

## 2018-04-27 LAB — COMPREHENSIVE METABOLIC PANEL
ALBUMIN: 4.8 g/dL (ref 3.5–5.0)
ALK PHOS: 61 U/L (ref 38–126)
ALT: 22 U/L (ref 0–44)
ANION GAP: 7 (ref 5–15)
AST: 23 U/L (ref 15–41)
BILIRUBIN TOTAL: 0.6 mg/dL (ref 0.3–1.2)
BUN: 7 mg/dL (ref 6–20)
CALCIUM: 9.3 mg/dL (ref 8.9–10.3)
CO2: 21 mmol/L — ABNORMAL LOW (ref 22–32)
Chloride: 106 mmol/L (ref 98–111)
Creatinine, Ser: 0.93 mg/dL (ref 0.61–1.24)
GFR calc Af Amer: >60 mL/min (ref >60)
GLUCOSE: 93 mg/dL (ref 70–99)
POTASSIUM: 3.5 mmol/L (ref 3.5–5.1)
Sodium: 134 mmol/L — ABNORMAL LOW (ref 135–145)
TOTAL PROTEIN: 8.2 g/dL — AB (ref 6.5–8.1)

## 2018-04-27 LAB — TROPONIN I

## 2018-04-27 MED ORDER — AZITHROMYCIN 500 MG IV SOLR
INTRAVENOUS | Status: AC
  Administered 2018-04-28: 02:00:00
  Filled 2018-04-27: qty 500

## 2018-04-27 MED ORDER — SODIUM CHLORIDE 0.9 % IV SOLN
2.0000 g | INTRAVENOUS | Status: DC
  Administered 2018-04-27 – 2018-04-28 (×2): 2 g via INTRAVENOUS
  Filled 2018-04-27: qty 2
  Filled 2018-04-27: qty 20

## 2018-04-27 MED ORDER — ACETAMINOPHEN 500 MG PO TABS
1000.0000 mg | ORAL_TABLET | Freq: Once | ORAL | Status: AC
  Administered 2018-04-27: 1000 mg via ORAL

## 2018-04-27 MED ORDER — SODIUM CHLORIDE 0.9 % IV SOLN
INTRAVENOUS | Status: DC | PRN
  Administered 2018-04-27 – 2018-04-28 (×2): 250 mL via INTRAVENOUS

## 2018-04-27 MED ORDER — ACETAMINOPHEN 500 MG PO TABS
ORAL_TABLET | ORAL | Status: AC
  Filled 2018-04-27: qty 1

## 2018-04-27 MED ORDER — SODIUM CHLORIDE 0.9% FLUSH
3.0000 mL | Freq: Once | INTRAVENOUS | Status: DC
  Filled 2018-04-27: qty 3

## 2018-04-27 MED ORDER — ALBUTEROL (5 MG/ML) CONTINUOUS INHALATION SOLN
15.0000 mg/h | INHALATION_SOLUTION | RESPIRATORY_TRACT | Status: DC
  Administered 2018-04-27: 15 mg/h via RESPIRATORY_TRACT
  Filled 2018-04-27: qty 20

## 2018-04-27 MED ORDER — ALBUTEROL SULFATE (2.5 MG/3ML) 0.083% IN NEBU
2.5000 mg | INHALATION_SOLUTION | Freq: Once | RESPIRATORY_TRACT | Status: AC
  Administered 2018-04-27: 2.5 mg via RESPIRATORY_TRACT
  Filled 2018-04-27: qty 3

## 2018-04-27 MED ORDER — IPRATROPIUM BROMIDE 0.02 % IN SOLN
0.5000 mg | Freq: Once | RESPIRATORY_TRACT | Status: AC
  Administered 2018-04-27: 0.5 mg via RESPIRATORY_TRACT
  Filled 2018-04-27: qty 2.5

## 2018-04-27 MED ORDER — IPRATROPIUM-ALBUTEROL 0.5-2.5 (3) MG/3ML IN SOLN
3.0000 mL | Freq: Once | RESPIRATORY_TRACT | Status: AC
  Administered 2018-04-27: 3 mL via RESPIRATORY_TRACT
  Filled 2018-04-27: qty 3

## 2018-04-27 MED ORDER — SODIUM CHLORIDE 0.9 % IV SOLN
500.0000 mg | INTRAVENOUS | Status: DC
  Administered 2018-04-28 (×2): 500 mg via INTRAVENOUS
  Filled 2018-04-27 (×2): qty 500

## 2018-04-27 NOTE — Progress Notes (Signed)
48 year old gentleman with history of COPD who presents with worsening dyspnea, cough and increased mucus production.  Initially with increased work of breathing, but has improved with bronchodilators, not yet back to his baseline, referred for admission for further management.

## 2018-04-27 NOTE — ED Triage Notes (Signed)
Cough for a month. Chills, SOB and weakness. No fever reducer since this am.

## 2018-04-27 NOTE — ED Provider Notes (Signed)
Emergency Department Provider Note   I have reviewed the triage vital signs and the nursing notes.   HISTORY  Chief Complaint Cough and Shortness of Breath   HPI Victor Floyd is a 48 y.o. male presents to the emergency department for evaluation of shortness of breath worsening over the past 7 days.  Patient has had cough for the past [redacted] weeks along with congestion, body aches, headache.  Patient has Caprina Wussow history of smoking and prior history of asthma.  Noticed increased shortness of breath and wheezing.  He is worse with exertion.  Denies chest pain or pressure.  Does have a mild headache.  He has been trying over-the-counter medications with no relief in symptoms.  No recent hospitalization.  No other modifying factors or radiation of symptoms.   History reviewed. No pertinent past medical history.  Patient Active Problem List   Diagnosis Date Noted  . COPD exacerbation ([place]) 04/27/2018  . Pain of left side of body 03/03/2015    History reviewed. No pertinent surgical history.  Allergies Patient has no known allergies.  No family history on file.  Social History Social History   Tobacco Use  . Smoking status: Current Every Day Smoker    Packs/day: 0.50    Types: Cigarettes  . Smokeless tobacco: Never Used  Substance Use Topics  . Alcohol use: Yes    Comment: weekly  . Drug use: Yes    Types: Marijuana    Review of Systems  Constitutional: No fever/chills. Positive fatigue and body aches.  Eyes: No visual changes. ENT: No sore throat. Cardiovascular: Denies chest pain. Respiratory: Positive shortness of breath and cough.  Gastrointestinal: No abdominal pain.  No nausea, no vomiting.  No diarrhea.  No constipation. Genitourinary: Negative for dysuria. Musculoskeletal: Negative for back pain. Skin: Negative for rash. Neurological: Negative for focal weakness or numbness. Positive HA.   10-point ROS otherwise  negative.  ____________________________________________   PHYSICAL EXAM:  VITAL SIGNS: ED Triage Vitals  Enc Vitals Group     BP 04/27/18 2216 136/88     Pulse Rate 04/27/18 2216 96     Resp 04/27/18 2216 (!) 26     Temp 04/27/18 2216 100.2 F (37.9 C)     Temp Source 04/27/18 2216 Oral     SpO2 04/27/18 2216 91 %     Weight 04/27/18 2212 184 lb 15.5 oz (83.9 kg)     Height 04/27/18 2212 6' (1.829 m)     Pain Score 04/27/18 2212 7   Constitutional: Alert and oriented. Patient appears uncomfortable with increased WOB.  Eyes: Conjunctivae are normal.  Head: Atraumatic. Nose: No congestion/rhinnorhea. Mouth/Throat: Mucous membranes are moist.  Neck: No stridor.   Cardiovascular: Normal rate, regular rhythm. Good peripheral circulation. Grossly normal heart sounds.   Respiratory: Positive increased respiratory effort.  No retractions. Lungs with inspiratory and expiratory wheezing bilaterally.  Gastrointestinal: Soft and nontender. No distention.  Musculoskeletal: No lower extremity tenderness nor edema. No gross deformities of extremities. Neurologic:  Normal speech and language. No gross focal neurologic deficits are appreciated.  Skin:  Skin is warm, dry and intact. No rash noted.  ____________________________________________   LABS (all labs ordered are listed, but only abnormal results are displayed)  Labs Reviewed  COMPREHENSIVE METABOLIC PANEL - Abnormal; Notable for the following components:      Result Value   Sodium 134 (*)    CO2 21 (*)    Total Protein 8.2 (*)    All other  components within normal limits  CBC WITH DIFFERENTIAL/PLATELET - Abnormal; Notable for the following components:   WBC 14.5 (*)    Neutro Abs 10.7 (*)    Monocytes Absolute 1.3 (*)    Abs Immature Granulocytes 0.08 (*)    All other components within normal limits  CULTURE, BLOOD (ROUTINE X 2)  CULTURE, BLOOD (ROUTINE X 2)  LACTIC ACID, PLASMA  TROPONIN I  URINALYSIS, ROUTINE W REFLEX  MICROSCOPIC  INFLUENZA PANEL BY PCR (TYPE A & B)   ____________________________________________  EKG   EKG Interpretation  Date/Time:  Monday April 27 2018 22:31:17 EST Ventricular Rate:  99 PR Interval:    QRS Duration: 83 QT Interval:  338 QTC Calculation: 434 R Axis:   53 Text Interpretation:  Sinus rhythm Minimal ST elevation, anterior leads No STEMI.  ST changes similar to 2017 tracing.  Confirmed by Nanda Quinton [phone]) on 04/28/2018 12:30:14 AM       ____________________________________________  RADIOLOGY  Dg Chest Port 1 View  Result Date: 04/27/2018 CLINICAL DATA:  Cough, fever, chills EXAM: PORTABLE CHEST 1 VIEW COMPARISON:  03/30/2009 FINDINGS: Airspace opacity in the right upper lobe concerning for pneumonia. Cardiomegaly. Left lung clear. No effusions or acute bony abnormality. IMPRESSION: Cardiomegaly. Right upper lobe pneumonia. Electronically Signed   By: Rolm Baptise M.D.   On: 04/27/2018 22:42    ____________________________________________   PROCEDURES  Procedure(s) performed:   Procedures  CRITICAL CARE Performed by: Margette Fast Total critical care time: 35 minutes Critical care time was exclusive of separately billable procedures and treating other patients. Critical care was necessary to treat or prevent imminent or life-threatening deterioration. Critical care was time spent personally by me on the following activities: development of treatment plan with patient and/or surrogate as well as nursing, discussions with consultants, evaluation of patient's response to treatment, examination of patient, obtaining history from patient or surrogate, ordering and performing treatments and interventions, ordering and review of laboratory studies, ordering and review of radiographic studies, pulse oximetry and re-evaluation of patient's condition.  Nanda Quinton, MD Emergency Medicine  ____________________________________________   INITIAL IMPRESSION  / ASSESSMENT AND PLAN / ED COURSE  Pertinent labs & imaging results that were available during my care of the patient were reviewed by me and considered in my medical decision making (see chart for details).  Patient presents to the emergency department with shortness of breath and cough.  He has had flulike symptoms worsening over the past week.  Lower suspicion for acute coronary syndrome or PE in the setting of flu-like symptoms and borderline fever.  Lactate is normal so doubt sepsis.  Portable chest x-ray shows concern for infiltrate on the right.  Low TB risk.  Has improved with initial nebulizer treatment along with 1 hour of continuous neb.  Starting coverage for community-acquired pneumonia.   Discussed patient's case with Hospitalist, Dr. Cathlean Sauer to request admission. Patient and family (if present) updated with plan. Care transferred to Hospitalist service.  I reviewed all nursing notes, vitals, pertinent old records, EKGs, labs, imaging (as available).  ____________________________________________  FINAL CLINICAL IMPRESSION(S) / ED DIAGNOSES  Final diagnoses:  Community acquired pneumonia of right lung, unspecified part of lung  Moderate persistent asthma with exacerbation      MEDICATIONS GIVEN DURING THIS VISIT:  Medications  albuterol (PROVENTIL,VENTOLIN) solution continuous neb (15 mg/hr Nebulization New Bag/Given 04/27/18 2303)  cefTRIAXone (ROCEPHIN) 2 g in sodium chloride 0.9 % 100 mL IVPB ( Intravenous Stopped 04/27/18 2357)  azithromycin (ZITHROMAX) 500  mg in sodium chloride 0.9 % 250 mL IVPB (500 mg Intravenous New Bag/Given 04/28/18 0027)  acetaminophen (TYLENOL) 500 MG tablet (has no administration in time range)  azithromycin (ZITHROMAX) 500 MG injection (has no administration in time range)  0.9 %  sodium chloride infusion (250 mLs Intravenous New Bag/Given 04/27/18 2325)  ibuprofen (ADVIL,MOTRIN) tablet 600 mg (has no administration in time range)  ibuprofen  (ADVIL,MOTRIN) 400 MG tablet (has no administration in time range)  ibuprofen (ADVIL,MOTRIN) 200 MG tablet (has no administration in time range)  ondansetron (ZOFRAN) 4 MG/2ML injection (has no administration in time range)  ipratropium-albuterol (DUONEB) 0.5-2.5 (3) MG/3ML nebulizer solution 3 mL (3 mLs Nebulization Given 04/27/18 2226)  albuterol (PROVENTIL) (2.5 MG/3ML) 0.083% nebulizer solution 2.5 mg (2.5 mg Nebulization Given 04/27/18 2226)  ipratropium (ATROVENT) nebulizer solution 0.5 mg (0.5 mg Nebulization Given 04/27/18 2303)  acetaminophen (TYLENOL) tablet 1,000 mg (1,000 mg Oral Given 04/27/18 2256)    Note:  This document was prepared using Dragon voice recognition software and may include unintentional dictation errors.  Nanda Quinton, MD Emergency Medicine    Author [place], [place], MD 04/28/18 [phone]

## 2018-04-28 ENCOUNTER — Inpatient Hospital Stay (HOSPITAL_COMMUNITY): Payer: 59

## 2018-04-28 DIAGNOSIS — J189 Pneumonia, unspecified organism: Secondary | ICD-10-CM

## 2018-04-28 DIAGNOSIS — Z720 Tobacco use: Secondary | ICD-10-CM | POA: Diagnosis present

## 2018-04-28 DIAGNOSIS — I517 Cardiomegaly: Secondary | ICD-10-CM

## 2018-04-28 LAB — RESPIRATORY PANEL BY PCR

## 2018-04-28 LAB — URINALYSIS, ROUTINE W REFLEX MICROSCOPIC
BACTERIA UA: NONE SEEN
Bilirubin Urine: NEGATIVE
Glucose, UA: NEGATIVE mg/dL
Hgb urine dipstick: NEGATIVE
Ketones, ur: 20 mg/dL — AB
Leukocytes,Ua: NEGATIVE
Nitrite: NEGATIVE
Protein, ur: NEGATIVE mg/dL
SPECIFIC GRAVITY, URINE: 1.02 (ref 1.005–1.030)
pH: 5 (ref 5.0–8.0)

## 2018-04-28 LAB — INFLUENZA PANEL BY PCR (TYPE A & B)
INFLAPCR: NEGATIVE
INFLBPCR: NEGATIVE

## 2018-04-28 LAB — ECHOCARDIOGRAM COMPLETE
HEIGHTINCHES: 72 in
Weight: 2959.46 oz

## 2018-04-28 MED ORDER — ACETAMINOPHEN 650 MG RE SUPP: 650.0000 mg | Freq: Four times a day (QID) | RECTAL | Status: DC | PRN

## 2018-04-28 MED ORDER — ALBUTEROL SULFATE (2.5 MG/3ML) 0.083% IN NEBU
2.5000 mg | INHALATION_SOLUTION | Freq: Three times a day (TID) | RESPIRATORY_TRACT | Status: DC
  Administered 2018-04-29: 2.5 mg via RESPIRATORY_TRACT
  Filled 2018-04-28: qty 3

## 2018-04-28 MED ORDER — ENOXAPARIN SODIUM 40 MG/0.4ML ~~LOC~~ SOLN
40.0000 mg | SUBCUTANEOUS | Status: DC
  Administered 2018-04-28: 40 mg via SUBCUTANEOUS
  Filled 2018-04-28 (×2): qty 0.4

## 2018-04-28 MED ORDER — IBUPROFEN 400 MG PO TABS
ORAL_TABLET | ORAL | Status: AC
  Filled 2018-04-28: qty 1

## 2018-04-28 MED ORDER — ONDANSETRON HCL 4 MG/2ML IJ SOLN: 4.0000 mg | Freq: Four times a day (QID) | INTRAMUSCULAR | Status: DC | PRN

## 2018-04-28 MED ORDER — VITAMIN B-1 100 MG PO TABS
100.0000 mg | ORAL_TABLET | Freq: Every day | ORAL | Status: DC
  Administered 2018-04-28 – 2018-04-29 (×2): 100 mg via ORAL
  Filled 2018-04-28 (×2): qty 1

## 2018-04-28 MED ORDER — ONDANSETRON HCL 4 MG/2ML IJ SOLN
INTRAMUSCULAR | Status: AC
  Filled 2018-04-28: qty 2

## 2018-04-28 MED ORDER — IBUPROFEN 400 MG PO TABS
600.0000 mg | ORAL_TABLET | Freq: Once | ORAL | Status: AC
  Administered 2018-04-28: 600 mg via ORAL

## 2018-04-28 MED ORDER — ONDANSETRON HCL 4 MG/2ML IJ SOLN
4.0000 mg | Freq: Once | INTRAMUSCULAR | Status: AC
  Administered 2018-04-28: 4 mg via INTRAVENOUS

## 2018-04-28 MED ORDER — ACETAMINOPHEN 325 MG PO TABS: 650.0000 mg | ORAL_TABLET | Freq: Four times a day (QID) | ORAL | Status: DC | PRN

## 2018-04-28 MED ORDER — IBUPROFEN 200 MG PO TABS
ORAL_TABLET | ORAL | Status: AC
  Filled 2018-04-28: qty 1

## 2018-04-28 MED ORDER — BUDESONIDE 0.25 MG/2ML IN SUSP
0.2500 mg | Freq: Two times a day (BID) | RESPIRATORY_TRACT | Status: DC
  Administered 2018-04-28 – 2018-04-29 (×2): 0.25 mg via RESPIRATORY_TRACT
  Filled 2018-04-28 (×2): qty 2

## 2018-04-28 MED ORDER — ADULT MULTIVITAMIN W/MINERALS CH
1.0000 | ORAL_TABLET | Freq: Every day | ORAL | Status: DC
  Administered 2018-04-28 – 2018-04-29 (×2): 1 via ORAL
  Filled 2018-04-28 (×2): qty 1

## 2018-04-28 MED ORDER — ALBUTEROL SULFATE (2.5 MG/3ML) 0.083% IN NEBU
2.5000 mg | INHALATION_SOLUTION | Freq: Once | RESPIRATORY_TRACT | Status: AC
  Administered 2018-04-28: 2.5 mg via RESPIRATORY_TRACT

## 2018-04-28 MED ORDER — ZOLPIDEM TARTRATE 5 MG PO TABS
5.0000 mg | ORAL_TABLET | Freq: Every evening | ORAL | Status: DC | PRN
  Administered 2018-04-29: 5 mg via ORAL
  Filled 2018-04-28: qty 1

## 2018-04-28 MED ORDER — METHYLPREDNISOLONE SODIUM SUCC 125 MG IJ SOLR
60.0000 mg | Freq: Two times a day (BID) | INTRAMUSCULAR | Status: DC
  Administered 2018-04-28 – 2018-04-29 (×2): 60 mg via INTRAVENOUS
  Filled 2018-04-28 (×2): qty 2

## 2018-04-28 MED ORDER — INFLUENZA VAC SPLIT QUAD 0.5 ML IM SUSY
0.5000 mL | PREFILLED_SYRINGE | INTRAMUSCULAR | Status: DC
  Filled 2018-04-28: qty 0.5

## 2018-04-28 MED ORDER — SODIUM CHLORIDE 0.45 % IV SOLN
INTRAVENOUS | Status: AC
  Administered 2018-04-28 (×2): via INTRAVENOUS

## 2018-04-28 MED ORDER — ALBUTEROL SULFATE (2.5 MG/3ML) 0.083% IN NEBU
2.5000 mg | INHALATION_SOLUTION | RESPIRATORY_TRACT | Status: DC | PRN
  Administered 2018-04-28 – 2018-04-29 (×4): 2.5 mg via RESPIRATORY_TRACT
  Filled 2018-04-28 (×6): qty 3

## 2018-04-28 MED ORDER — ONDANSETRON HCL 4 MG PO TABS: 4.0000 mg | ORAL_TABLET | Freq: Four times a day (QID) | ORAL | Status: DC | PRN

## 2018-04-28 MED ORDER — NICOTINE 14 MG/24HR TD PT24
14.0000 mg | MEDICATED_PATCH | Freq: Every day | TRANSDERMAL | Status: DC
  Filled 2018-04-28 (×2): qty 1

## 2018-04-28 NOTE — ED Notes (Signed)
[facility] notified ([facility]) - patient ready for transport 

## 2018-04-28 NOTE — H&P (Addendum)
[facility] History and Physical  Victor Floyd PZW:[id number] DOB: 08/07/1970 DOA: 04/27/2018   PCP: Patient, No Pcp Per  Specialists: None  Chief Complaint: Shortness of breath, cough, fever  HPI: Victor Floyd is a 48 y.o. male with no significant past medical history, does not take medications on a regular basis, does have a history of cigarette and cigar smoking who was in his usual state of health till about 3 weeks ago when he started developing cough.  He had sick contacts including his mother.  He started having some wheezing.  He continues to smoke cigars.  Symptoms progressively got worse and over the last few days he has noted fever and chills.  According to his wife his temperature was 62 F yesterday.  He has not taken flu shot this season.  Denies any chest pain per se.  Due to the cough he is experiencing some pain in his rib area on the sides.  Some nausea and dry heaves yesterday but none before that or since then.  The cough is dry.  Denies any history of weight loss, incarceration, travel outside the country, significant night sweats.  In the emergency department patient was found to have a right upper lobe pneumonia.  He was noted to be wheezing and was given nebulizer treatments.  There was concern for COPD exacerbation.  He was thought to require hospitalization for further evaluation and management.  Home Medications: Prior to Admission medications   Medication Sig Start Date End Date Taking? Authorizing Provider  naproxen (NAPROSYN) 500 MG tablet Take 1 tablet (500 mg total) by mouth 2 (two) times daily. Patient not taking: Reported on 04/28/2018 12/09/17   Carlisle Cater, PA-C    Allergies: No Known Allergies  Past Medical History: Patient denies any health problems previously.  Social History: She lives with his wife and children.  Smokes up to 5 short cigars a day.  Previous history of cigarette smoking is present.  Drinks up to 2 beers a day.  Denies  any liquor intake of wine intake.  Marijuana use regularly.  No cocaine or heroin use.   Family History:  unspecified Congestive heart failure is present in the family  Review of Systems - History obtained from the patient General ROS: positive for  - chills, fatigue and fever Psychological ROS: negative Ophthalmic ROS: negative ENT ROS: negative Allergy and Immunology ROS: negative Hematological and Lymphatic ROS: negative Endocrine ROS: negative Respiratory ROS: as in hpi Cardiovascular ROS: as in hpi Gastrointestinal ROS: no abdominal pain, change in bowel habits, or black or bloody stools Genito-Urinary ROS: no dysuria, trouble voiding, or hematuria Musculoskeletal ROS: negative Neurological ROS: no TIA or stroke symptoms Dermatological ROS: negative  Physical Examination  Vitals:   04/28/18 0000 04/28/18 0029 04/28/18 0127 04/28/18 0525  BP: 130/82 122/80 109/77 108/68  Pulse: (!) 123 (!) 128 (!) 118 94  Resp: (!) 25 (!) 26 (!) 22 16  Temp:  100.3 F (37.9 C) 99.2 F (37.3 C) 98.5 F (36.9 C)  TempSrc:  Oral Oral   SpO2: 92% 92% 97% 99%  Weight:      Height:        BP 108/68   Pulse 94   Temp 98.5 F (36.9 C)   Resp 16   Ht 6' (1.829 m)   Wt 83.9 kg   SpO2 99%   BMI 25.09 kg/m   General appearance: alert, cooperative, appears stated age and no distress Head: Normocephalic, without obvious abnormality, atraumatic  Eyes: conjunctivae/corneas clear. PERRL, EOM's intact.  Throat: lips, mucosa, and tongue normal; teeth and gums normal Neck: no adenopathy, no carotid bruit, no JVD, supple, symmetrical, trachea midline and thyroid not enlarged, symmetric, no tenderness/mass/nodules Resp: Breath sounds bilaterally.  Very scattered wheezes.  Rhonchi in the right upper chest.  Few crackles.  Normal effort noted at this time. Cardio: regular rate and rhythm, S1, S2 normal, no murmur, click, rub or gallop GI: soft, non-tender; bowel sounds normal; no masses,  no  organomegaly Extremities: extremities normal, atraumatic, no cyanosis or edema Pulses: 2+ and symmetric Skin: Skin color, texture, turgor normal. No rashes or lesions Lymph nodes: Cervical, supraclavicular, and axillary nodes normal. Neurologic: Alert and oriented x3.  No focal neurological deficits.   Labs on Admission: I have personally reviewed following labs and imaging studies  CBC: Recent Labs  Lab 04/27/18 2237  WBC 14.5*  NEUTROABS 10.7*  HGB 14.5  HCT 44.7  MCV 88.7  PLT 016   Basic Metabolic Panel: Recent Labs  Lab 04/27/18 2237  NA 134*  K 3.5  CL 106  CO2 21*  GLUCOSE 93  BUN 7  CREATININE 0.93  CALCIUM 9.3   GFR: Estimated Creatinine Clearance: 110.1 mL/min (by C-G formula based on SCr of 0.93 mg/dL). Liver Function Tests: Recent Labs  Lab 04/27/18 2237  AST 23  ALT 22  ALKPHOS 61  BILITOT 0.6  PROT 8.2*  ALBUMIN 4.8   Cardiac Enzymes: Recent Labs  Lab 04/27/18 2227  TROPONINI <0.03     Radiological Exams on Admission: Dg Chest Port 1 View  Result Date: 04/27/2018 CLINICAL DATA:  Cough, fever, chills EXAM: PORTABLE CHEST 1 VIEW COMPARISON:  03/30/2009 FINDINGS: Airspace opacity in the right upper lobe concerning for pneumonia. Cardiomegaly. Left lung clear. No effusions or acute bony abnormality. IMPRESSION: Cardiomegaly. Right upper lobe pneumonia. Electronically Signed   By: Rolm Baptise M.D.   On: 04/27/2018 22:42    [name]: Sinus rhythm 99 bpm.  Normal axis.  Intervals are normal.  No concerning ST or T wave changes.  Changes concerning for LVH noted.    Problem List  Principal Problem:   Community acquired pneumonia of right lung Active Problems:   COPD exacerbation ([place])   Tobacco abuse   Assessment: This is a 48 year old African-American male with no significant past medical history who comes in with a three-week history of shortness of breath, cough, wheezing and then developed fever  overnight.  He is found to have a right upper lobe pneumonia.  He does have a history of tobacco dependence.  He could have an element of COPD.  Plan:  1. Community-acquired pneumonia/Mild Acute COPD exacerbation: He does not have any risk factors for AFB.  No history of incarceration, travel outside the country, or exposure to tuberculosis.  Denies any weight loss or significant night sweats.  He will be treated with the ceftriaxone and azithromycin.  Strep pneumonia antigen to be checked in the urine.  He will need to have a chest x-ray repeated in 4 to 6 weeks to make sure the infiltrate has resolved.  He did have a high fever.  Influenza PCR is pending.  Follow-up on blood cultures.  WBC was high.  Lactic acid level was normal.  No clear evidence for sepsis.  Since his reactive airway disease has improved significantly with nebulizer treatment we will hold off on steroids for now.  ADDENDUM Called by the nurse stating that the patient was wheezing much  more in the afternoon.  Extra nebulizer treatments to be ordered.  We will also give him budesonide nebulized.  We will start systemic steroids.  2.  Cardiomegaly: Incidentally noted on chest x-ray.  Patient reports history of unspecified congestive heart failure in the family but no personal history of same.  We will do an echocardiogram.  EKG as mentioned above.  Patient denies any chest discomfort.  3.  Tobacco dependence: Patient was counseled.  Nicotine patch  4.  Alcohol use: Up to 2 beers a day.  Low risk for withdrawal.  Thiamine, multivitamins.   DVT Prophylaxis: Lovenox Code Status: Full code Family Communication: Discussed with the patient and his wife Disposition: Hopefully return home when improved Consults called: None Admission Status: Inpatient  Severity of Illness: The appropriate patient status for this patient is INPATIENT. Inpatient status is judged to be reasonable and necessary in order to provide the required  intensity of service to ensure the patient's safety. The patient's presenting symptoms, physical exam findings, and initial radiographic and laboratory data in the context of their chronic comorbidities is felt to place them at high risk for further clinical deterioration. Furthermore, it is not anticipated that the patient will be medically stable for discharge from the hospital within 2 midnights of admission. The following factors support the patient status of inpatient.   " The patient's presenting symptoms include shortness of breath, cough, fever. " The worrisome physical exam findings include wheezing, tachypnea. " The initial radiographic and laboratory data are worrisome because of leukocytosis, pneumonia. " The chronic co-morbidities include tobacco dependence.   * I certify that at the point of admission it is my clinical judgment that the patient will require inpatient hospital care spanning beyond 2 midnights from the point of admission due to high intensity of service, high risk for further deterioration and high frequency of surveillance required.*  Further management decisions will depend on results of further testing and patient's response to treatment.  Auren Valdes [employer]  [facility] operational officer on [email or web address]  04/28/2018, 7:55 AM

## 2018-04-28 NOTE — Progress Notes (Signed)
  Echocardiogram 2D Echocardiogram has been performed.  Victor Floyd 04/28/2018, 12:31 PM

## 2018-04-29 DIAGNOSIS — Z720 Tobacco use: Secondary | ICD-10-CM

## 2018-04-29 DIAGNOSIS — J181 Lobar pneumonia, unspecified organism: Secondary | ICD-10-CM

## 2018-04-29 DIAGNOSIS — J441 Chronic obstructive pulmonary disease with (acute) exacerbation: Secondary | ICD-10-CM

## 2018-04-29 LAB — CBC
HCT: 42.7 % (ref 39.0–52.0)
Hemoglobin: 13.6 g/dL (ref 13.0–17.0)
MCH: 28.9 pg (ref 26.0–34.0)
MCHC: 31.9 g/dL (ref 30.0–36.0)
MCV: 90.7 fL (ref 80.0–100.0)
Platelets: 197 10*3/uL (ref 150–400)
RBC: 4.71 MIL/uL (ref 4.22–5.81)
RDW: 14.4 % (ref 11.5–15.5)
WBC: 14.7 10*3/uL — ABNORMAL HIGH (ref 4.0–10.5)
nRBC: 0 % (ref 0.0–0.2)

## 2018-04-29 LAB — BASIC METABOLIC PANEL
Anion gap: 10 (ref 5–15)
BUN: 12 mg/dL (ref 6–20)
CO2: 21 mmol/L — ABNORMAL LOW (ref 22–32)
Calcium: 9.1 mg/dL (ref 8.9–10.3)
Chloride: 106 mmol/L (ref 98–111)
Creatinine, Ser: 0.92 mg/dL (ref 0.61–1.24)
GFR calc Af Amer: >60 mL/min (ref >60)
GFR calc non Af Amer: >60 mL/min (ref >60)
GLUCOSE: 112 mg/dL — AB (ref 70–99)
Potassium: 4 mmol/L (ref 3.5–5.1)
Sodium: 137 mmol/L (ref 135–145)

## 2018-04-29 LAB — HIV ANTIBODY (ROUTINE TESTING W REFLEX): HIV Screen 4th Generation wRfx: NONREACTIVE

## 2018-04-29 MED ORDER — CEFDINIR 300 MG PO CAPS: 300.0000 mg | ORAL_CAPSULE | Freq: Two times a day (BID) | ORAL | 0 refills | Status: DC

## 2018-04-29 MED ORDER — PREDNISONE 20 MG PO TABS: 40.0000 mg | ORAL_TABLET | Freq: Every day | ORAL | 0 refills | Status: DC

## 2018-04-29 MED ORDER — ALBUTEROL SULFATE (2.5 MG/3ML) 0.083% IN NEBU: 2.5000 mg | INHALATION_SOLUTION | RESPIRATORY_TRACT | 0 refills | Status: DC | PRN

## 2018-04-29 NOTE — Progress Notes (Signed)
04/29/2018  Discharge instructions were reviewed with patient by [facility].

## 2018-04-29 NOTE — Progress Notes (Signed)
04/29/2018  1145 Waiting for [facility] to come with nebulizer machine.

## 2018-04-29 NOTE — Care Management Note (Signed)
Case Management Note  Patient Details  Name: Victor Floyd MRN: [id number] Date of Birth: 1970-08-20  Subjective/Objective:                  Discharge planning  Action/Plan: [facility]ulizer from [facility] ordered.  Expected Discharge Date:  04/29/18               Expected Discharge Plan:  Home/Self Care  In-House Referral:     Discharge planning Services  CM Consult  Post Acute Care Choice:  Durable Medical Equipment, NA Choice offered to:     DME Arranged:  Nebulizer machine DME Agency:  [place]:    [facility] Agency:     Status of Service:  Completed, signed off  If discussed at [place] of Stay Meetings, dates discussed:    Additional Comments:  Leeroy Cha, RN 04/29/2018, 11:28 AM

## 2018-04-29 NOTE — Progress Notes (Signed)
04/29/2018  1458  Patient's wife came to pickup nebulizer machine. Wife educated on how to put medicine in container. Pt wife states she has used neb machines before with her kids. Wife verbalized understanding on how to use neb machine. Notified pt's wife that she may have to pay $12.79 for machine and insurance should cover the rest. Per wife that's ok she is willing to pay the $12.79 difference for neb machine.

## 2018-04-29 NOTE — Care Management Note (Signed)
Case Management Note  Patient Details  Name: Victor Floyd MRN: [id number] Date of Birth: October 23, 1970  Subjective/Objective:                  discharged  Action/Plan: Discharged to home with self-care and family support Orders checked for [facility] needs. No CM needs present at time of discharge. Patient prefers to make own follow appointments has needed.  Expected Discharge Date:  04/29/18               Expected Discharge Plan:  Home/Self Care  In-House Referral:     Discharge planning Services  CM Consult  Post Acute Care Choice:    Choice offered to:     DME Arranged:    DME Agency:     HH Arranged:    HH Agency:     Status of Service:  Completed, signed off  If discussed at [employer] of Stay Meetings, dates discussed:    Additional Comments:  Leeroy Cha, RN 04/29/2018, 10:26 AM

## 2018-04-29 NOTE — Plan of Care (Signed)
  Problem: Nutrition: Goal: Adequate nutrition will be maintained Outcome: Progressing   Problem: Elimination: Goal: Will not experience complications related to bowel motility Outcome: Progressing   Problem: Pain Managment: Goal: General experience of comfort will improve Outcome: Progressing   

## 2018-04-29 NOTE — Discharge Summary (Signed)
Physician Discharge Summary  Victor Floyd:[id number] DOB: [birth year] DOA: 04/27/2018  PCP: Patient, No Pcp Per  Admit date: 04/27/2018 Discharge date: 04/29/2018  Admitted From: Home Disposition: Home   Recommendations for Outpatient Follow-up:  1. Follow up with PCP in 1-2 weeks 2. Smoking cessation counseling and support as needed.  Home Health: None Equipment/Devices: Nebulizer Discharge Condition: Stable CODE STATUS: Full Diet recommendation: As tolerated  Brief/Interim Summary: Victor Floyd is a 48 y.o. male with no significant past medical history, does not take medications on a regular basis, does have a history of cigarette and cigar smoking who was in his usual state of health till about 3 weeks ago when he started developing cough.  He had sick contacts including his mother.  He started having some wheezing.  He continues to smoke cigars.  Symptoms progressively got worse and over the last few days he has noted fever and chills.  According to his wife his temperature was 57 F yesterday.  He has not taken flu shot this season.  Denies any chest pain per se.  Due to the cough he is experiencing some pain in his rib area on the sides.  Some nausea and dry heaves yesterday but none before that or since then.  The cough is dry.  Denies any history of weight loss, incarceration, travel outside the country, significant night sweats.  In the emergency department patient was found to have a right upper lobe pneumonia.  He was noted to be wheezing and was given nebulizer treatments.  There was concern for COPD exacerbation.  He was thought to require hospitalization for further evaluation and management. Respiratory viral panel returned positive for rhinovirus, negative for flu.   Discharge Diagnoses:  Principal Problem:   Community acquired pneumonia of right lung Active Problems:   COPD exacerbation ([place])   Tobacco abuse  Acute COPD exacerbation: Due to rhinovirus and  CAP. Improving.  - Continue cefdinir and prednisone to complete 5 days.  - Benefit from nebulizer. Does not have this at home. We will arrange this prior to discharge as it will reduce his risk of readmission/returning for care.  CAP:  - Abx as above.  - Monitor culture data (blood cultures NGTD at discharge).  - Recommend repeat CXR in 4 to 6 weeks to make sure the infiltrate has resolved.  Grade 1 diastolic dysfunction, does not clinically have heart failure: Echo otherwise unremarkable.  - Manage BP  Tobacco use:  - Extensive cessation counseling provided. Pt is ready for action, will stop cold [place], declines any interventions.   Alcohol use: Recommend continued moderation.    Discharge Instructions Discharge Instructions    Diet - low sodium heart healthy   Complete by:  As directed    Discharge instructions   Complete by:  As directed    You were treated for shortness of breath/cough due to pneumonia and viral bronchitis due to rhinovirus which caused wheezing, possibly due to a COPD exacerbation. It is important that you follow up with a doctor to continue management and further work up for this. It is also important that you stop smoking.  - Take cefdinir and prednisone daily for the next 4 days to complete the course. - Use albuterol in the nebulizer as needed for wheezing or shortness of breath - Seek medical attention right away if your symptoms return.   Increase activity slowly   Complete by:  As directed      Allergies as of 04/29/2018  No Known Allergies     Medication List    STOP taking these medications   naproxen 500 MG tablet Commonly known as:  NAPROSYN     TAKE these medications   albuterol (2.5 MG/3ML) 0.083% nebulizer solution Commonly known as:  PROVENTIL Take 3 mLs (2.5 mg total) by nebulization every 4 (four) hours as needed for wheezing or shortness of breath.   cefdinir 300 MG capsule Commonly known as:  OMNICEF Take 1 capsule (300 mg  total) by mouth 2 (two) times daily.   predniSONE 20 MG tablet Commonly known as:  DELTASONE Take 2 tablets (40 mg total) by mouth daily.            Durable Medical Equipment  (From admission, onward)         Start     Ordered   04/29/18 0853  For home use only DME Nebulizer/meds  Once    Question:  Patient needs a nebulizer to treat with the following condition  Answer:  COPD exacerbation ([place])   04/29/18 0854         Follow-up Information    Primary Care Provider. Schedule an appointment as soon as possible for a visit.          No Known Allergies  Consultations:  None  Procedures/Studies: Dg Chest Port 1 View  Result Date: 04/27/2018 CLINICAL DATA:  Cough, fever, chills EXAM: PORTABLE CHEST 1 VIEW COMPARISON:  03/30/2009 FINDINGS: Airspace opacity in the right upper lobe concerning for pneumonia. Cardiomegaly. Left lung clear. No effusions or acute bony abnormality. IMPRESSION: Cardiomegaly. Right upper lobe pneumonia. Electronically Signed   By: Rolm Baptise M.D.   On: 04/27/2018 22:42    Subjective: "I feel great, you're going to let me go home, right?" Wheezing has resolved, no fever on day of discharge. Breathing near baseline, nebs really helpful.   Discharge Exam: Vitals:   04/29/18 0510 04/29/18 0831  BP: 126/89   Pulse: 85   Resp: 16   Temp: 98.4 F (36.9 C)   SpO2: 97% 98%   General: Pt is alert, awake, not in acute distress Cardiovascular: RRR, S1/S2 +, no rubs, no gallops Respiratory: Nonlabored on room air, no wheezing. Abdominal: Soft, NT, ND, bowel sounds + Extremities: No edema, no cyanosis  Labs: BNP (last 3 results) No results for input(s): BNP in the last 8760 hours. Basic Metabolic Panel: Recent Labs  Lab 04/27/18 2237 04/29/18 0713  NA 134* 137  K 3.5 4.0  CL 106 106  CO2 21* 21*  GLUCOSE 93 112*  BUN 7 12  CREATININE 0.93 0.92  CALCIUM 9.3 9.1   Liver Function Tests: Recent Labs  Lab 04/27/18 2237  AST 23  ALT  22  ALKPHOS 61  BILITOT 0.6  PROT 8.2*  ALBUMIN 4.8   No results for input(s): LIPASE, AMYLASE in the last 168 hours. No results for input(s): AMMONIA in the last 168 hours. CBC: Recent Labs  Lab 04/27/18 2237 04/29/18 0713  WBC 14.5* 14.7*  NEUTROABS 10.7*  --   HGB 14.5 13.6  HCT 44.7 42.7  MCV 88.7 90.7  PLT 268 197   Cardiac Enzymes: Recent Labs  Lab 04/27/18 2227  TROPONINI <0.03   BNP: Invalid input(s): POCBNP CBG: No results for input(s): GLUCAP in the last 168 hours. D-Dimer No results for input(s): DDIMER in the last 72 hours. Hgb A1c No results for input(s): HGBA1C in the last 72 hours. Lipid Profile No results for input(s): CHOL, HDL, LDLCALC,  TRIG, CHOLHDL, LDLDIRECT in the last 72 hours. Thyroid function studies No results for input(s): TSH, T4TOTAL, T3FREE, THYROIDAB in the last 72 hours.  Invalid input(s): FREET3 Anemia work up No results for input(s): VITAMINB12, FOLATE, FERRITIN, TIBC, IRON, RETICCTPCT in the last 72 hours. Urinalysis    Component Value Date/Time   COLORURINE YELLOW 04/27/2018 2215   [place] 04/27/2018 2215   LABSPEC 1.020 04/27/2018 2215   PHURINE 5.0 04/27/2018 2215   [place] 04/27/2018 2215   HGBUR NEGATIVE 04/27/2018 2215   BILIRUBINUR NEGATIVE 04/27/2018 2215   KETONESUR 20 (A) 04/27/2018 2215   PROTEINUR NEGATIVE 04/27/2018 2215   NITRITE NEGATIVE 04/27/2018 [place] 04/27/2018 2215    Microbiology Recent Results (from the past 240 hour(s))  Respiratory Panel by PCR     Status: Abnormal   Collection Time: 04/27/18 10:37 PM  Result Value Ref Range Status   Adenovirus NOT DETECTED NOT DETECTED Final   Coronavirus 229E NOT DETECTED NOT DETECTED Final    Comment: (NOTE) The Coronavirus on the Respiratory Panel, DOES NOT test for the novel  Coronavirus (2019 nCoV)    Coronavirus HKU1 NOT DETECTED NOT DETECTED Final   Coronavirus NL63 NOT DETECTED NOT DETECTED Final    Coronavirus OC43 NOT DETECTED NOT DETECTED Final   Metapneumovirus NOT DETECTED NOT DETECTED Final   Rhinovirus / Enterovirus DETECTED (A) NOT DETECTED Final   Influenza A NOT DETECTED NOT DETECTED Final   Influenza B NOT DETECTED NOT DETECTED Final   Parainfluenza Virus 1 NOT DETECTED NOT DETECTED Final   Parainfluenza Virus 2 NOT DETECTED NOT DETECTED Final   Parainfluenza Virus 3 NOT DETECTED NOT DETECTED Final   Parainfluenza Virus 4 NOT DETECTED NOT DETECTED Final   Respiratory Syncytial Virus NOT DETECTED NOT DETECTED Final   Bordetella pertussis NOT DETECTED NOT DETECTED Final   Chlamydophila pneumoniae NOT DETECTED NOT DETECTED Final   Mycoplasma pneumoniae NOT DETECTED NOT DETECTED Final    Comment: Performed at [facility], [place] [address], [place], [place] [address]  Blood Culture (routine x 2)     Status: None (Preliminary result)   Collection Time: 04/27/18 11:15 PM  Result Value Ref Range Status   Specimen Description   Final    BLOOD LEFT FOREARM Performed at [facility], [address] [place], [place] [address]    Special Requests   Final    BOTTLES DRAWN AEROBIC AND ANAEROBIC Blood Culture adequate volume Performed at [facility] [address] [place], [place] [address]    Culture   Final    NO GROWTH < 24 HOURS Performed at [facility], [place] [address], [place], [place] [address]    Report Status PENDING  Incomplete  Blood Culture (routine x 2)     Status: None (Preliminary result)   Collection Time: 04/27/18 11:21 PM  Result Value Ref Range Status   Specimen Description   Final    BLOOD LEFT ARM Performed at [facility], [address] [place], [place] [address]    Special Requests   Final    BOTTLES DRAWN AEROBIC AND ANAEROBIC Blood Culture adequate volume Performed at [facility], [address] [place], [place] [address]    Culture   Final    NO GROWTH < 24 HOURS Performed at [facility], [place] [address], [place],  [address]    Report Status PENDING  Incomplete    Time coordinating discharge: Approximately 40 minutes  Patrecia Pour, MD  [facility] 04/29/2018, 3:43 PM Pager [phone]

## 2018-05-03 LAB — CULTURE, BLOOD (ROUTINE X 2)
Culture: NO GROWTH
Culture: NO GROWTH
Special Requests: ADEQUATE
Special Requests: ADEQUATE
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
## 2020-09-21 ENCOUNTER — Emergency Department (HOSPITAL_BASED_OUTPATIENT_CLINIC_OR_DEPARTMENT_OTHER)
Admission: EM | Admit: 2020-09-21 | Discharge: 2020-09-21 | Disposition: A | Discharge status: Home / Self Care | Payer: 59 | Attending: Emergency Medicine | Admitting: Emergency Medicine

## 2020-09-21 ENCOUNTER — Other Ambulatory Visit: Payer: Self-pay

## 2020-09-21 ENCOUNTER — Encounter (HOSPITAL_BASED_OUTPATIENT_CLINIC_OR_DEPARTMENT_OTHER): Payer: Self-pay | Admitting: *Deleted

## 2020-09-21 DIAGNOSIS — F17210 Nicotine dependence, cigarettes, uncomplicated: Secondary | ICD-10-CM | POA: Diagnosis not present

## 2020-09-21 DIAGNOSIS — J029 Acute pharyngitis, unspecified: Secondary | ICD-10-CM

## 2020-09-21 DIAGNOSIS — J441 Chronic obstructive pulmonary disease with (acute) exacerbation: Secondary | ICD-10-CM | POA: Insufficient documentation

## 2020-09-21 MED ORDER — DEXAMETHASONE 6 MG PO TABS: 10.0000 mg | ORAL_TABLET | Freq: Once | ORAL | Status: DC

## 2020-09-21 MED ORDER — DEXAMETHASONE SODIUM PHOSPHATE 10 MG/ML IJ SOLN
10.0000 mg | Freq: Once | INTRAMUSCULAR | Status: AC
  Administered 2020-09-21: 10 mg via INTRAMUSCULAR
  Filled 2020-09-21: qty 1

## 2020-09-21 MED ORDER — CLINDAMYCIN HCL 300 MG PO CAPS
300.0000 mg | ORAL_CAPSULE | Freq: Three times a day (TID) | ORAL | 0 refills | Status: DC
  Filled 2020-09-21: qty 30, 10d supply, fill #0

## 2020-09-21 MED ORDER — CLINDAMYCIN HCL 300 MG PO CAPS: 300.0000 mg | ORAL_CAPSULE | Freq: Three times a day (TID) | ORAL | 0 refills | Status: AC

## 2020-09-21 NOTE — ED Provider Notes (Signed)
[facility] EMERGENCY DEPARTMENT Provider Note   CSN: [id number] Arrival date & time: 09/21/20  1740     History Chief Complaint  Patient presents with   Sore Throat   Otalgia    Victor Floyd is a 50 y.o. male.  The history is provided by the patient.  Sore Throat This is a new problem. The problem occurs constantly. Pertinent negatives include no chest pain, no abdominal pain and no headaches. Nothing aggravates the symptoms. Nothing relieves the symptoms. He has tried nothing for the symptoms.  Otalgia Associated symptoms: sore throat   Associated symptoms: no abdominal pain, no congestion, no headaches and no vomiting       History reviewed. No pertinent past medical history.  Patient Active Problem List   Diagnosis Date Noted   Community acquired pneumonia of right lung 04/28/2018   Tobacco abuse 04/28/2018   COPD exacerbation ([place]) 04/27/2018   Pain of left side of body 03/03/2015    History reviewed. No pertinent surgical history.     No family history on file.  Social History   Tobacco Use   Smoking status: Every Day    Packs/day: 0.50    Types: Cigarettes   Smokeless tobacco: Never  Vaping Use   Vaping Use: Never used  Substance Use Topics   Alcohol use: Yes    Comment: weekly   Drug use: Yes    Types: Marijuana    Home Medications Prior to Admission medications   Medication Sig Start Date End Date Taking? Authorizing Provider  albuterol (PROVENTIL) (2.5 MG/3ML) 0.083% nebulizer solution Take 3 mLs (2.5 mg total) by nebulization every 4 (four) hours as needed for wheezing or shortness of breath. 04/29/18   Patrecia Pour, MD  cefdinir (OMNICEF) 300 MG capsule Take 1 capsule (300 mg total) by mouth 2 (two) times daily. 04/29/18   Patrecia Pour, MD  clindamycin (CLEOCIN) 300 MG capsule Take 1 capsule (300 mg total) by mouth 3 (three) times daily for 10 days. 09/21/20 10/01/20  Branson Kranz, DO  predniSONE (DELTASONE) 20 MG tablet Take 2  tablets (40 mg total) by mouth daily. 04/29/18   Patrecia Pour, MD    Allergies    Patient has no known allergies.  Review of Systems   Review of Systems  HENT:  Positive for ear pain, sore throat and trouble swallowing. Negative for congestion, sinus pain and voice change.   Cardiovascular:  Negative for chest pain.  Gastrointestinal:  Negative for abdominal pain, nausea and vomiting.  Neurological:  Negative for headaches.   Physical Exam Updated Vital Signs BP (!) 132/94 (BP Location: Left Arm)   Pulse (!) 101   Temp 99.8 F (37.7 C) (Oral)   Resp 18   Ht 6' (1.829 m)   Wt 83.9 kg   SpO2 100%   BMI 25.09 kg/m   Physical Exam Constitutional:      General: He is not in acute distress.    Appearance: He is not ill-appearing.  HENT:     Head: Normocephalic and atraumatic.     Right Ear: Tympanic membrane normal.     Left Ear: Tympanic membrane normal.     Nose: No congestion.     Mouth/Throat:     Mouth: Mucous membranes are moist.     Pharynx: Pharyngeal swelling, oropharyngeal exudate and posterior oropharyngeal erythema present.     Tonsils: Tonsillar exudate present. No tonsillar abscesses. 0 on the right. 1+ on the left.  Comments: There is some swelling to the soft palate on the left as well as exudates in the soft palate with erythema to the left tonsillar area but no obvious abscess, uvula is midline with no swelling, there is no trismus, no drooling, no submandibular swelling Eyes:     Conjunctiva/sclera: Conjunctivae normal.  Neurological:     Mental Status: He is alert.    ED Results / Procedures / Treatments   Labs (all labs ordered are listed, but only abnormal results are displayed) Labs Reviewed - No data to display  EKG None  Radiology No results found.  Procedures Procedures   Medications Ordered in ED Medications  dexamethasone (DECADRON) injection 10 mg (has no administration in time range)    ED Course  I have reviewed the triage  vital signs and the nursing notes.  Pertinent labs & imaging results that were available during my care of the patient were reviewed by me and considered in my medical decision making (see chart for details).    MDM Rules/Calculators/A&P                           LEVIE WAGES is here with sore throat, ear pain.  Overall appears to have pharyngitis on exam.  Has exudates erythema to the left tonsil/left side of the soft palate.  Soft palate has some swelling but uvula is midline.  No obvious abscess on exam.  No submandibular swelling, no trismus.  Given a dose of Decadron will prescribe clindamycin.  Understands return precautions given that there is some swelling concerned that abscess could develop.  Given information to follow-up with ENT for told to return to the ED if unable to follow-up with them.  Discharged in good condition.  This chart was dictated using voice recognition software.  Despite best efforts to proofread,  errors can occur which can change the documentation meaning. \ Final Clinical Impression(s) / ED Diagnoses Final diagnoses:  Pharyngitis, unspecified etiology    Rx / DC Orders ED Discharge Orders          Ordered    clindamycin (CLEOCIN) 300 MG capsule  3 times daily,   Status:  Discontinued        09/21/20 1805    clindamycin (CLEOCIN) 300 MG capsule  3 times daily        09/21/20 1806             Lennice Sites, DO 09/21/20 1810

## 2020-09-21 NOTE — Discharge Instructions (Addendum)
Take antibiotic as prescribed.  Recommend Tylenol Motrin for pain as well.  Please return if symptoms worsen as discussed.  Follow-up with ENT as well.

## 2020-09-21 NOTE — ED Triage Notes (Signed)
Left ear pain and sore throat x 3 days.

## 2020-09-22 ENCOUNTER — Other Ambulatory Visit (HOSPITAL_BASED_OUTPATIENT_CLINIC_OR_DEPARTMENT_OTHER): Payer: Self-pay

## 2021-06-01 ENCOUNTER — Ambulatory Visit: Payer: 59 | Admitting: Nurse Practitioner

## 2021-06-16 NOTE — Progress Notes (Signed)
? ?New Patient Office Visit ? ?Subjective:  ?Patient ID: Victor Floyd, male    DOB: 1970-06-10  Age: 51 y.o. MRN: [id number] ? ?CC:  ?Chief Complaint  ?Patient presents with  ? Annual Exam  ?  CPE, pt is fasting.   ? ? ?HPI ?Victor Floyd presents for new patient visit to establish care.  Introduced to Designer, jewellery role and practice setting.  All questions answered.  Discussed provider/patient relationship and expectations. ? ?Victor Floyd has a history of arthritis in his shoulders, and left hip.  He takes ibuprofen or Tylenol as needed for this, along with lidocaine patches.  He states that he does smoke marijuana as well which helps with the pain and helps him sleep.  He states that his job includes heavy lifting and frequent walking. ? ?Depression and anxiety screen done: ? ? ?  06/18/2021  ?  8:57 AM  ?Depression screen PHQ 2/9  ?Decreased Interest 0  ?Down, Depressed, Hopeless 1  ?PHQ - 2 Score 1  ?Altered sleeping 0  ?Tired, decreased energy 0  ?Change in appetite 0  ?Feeling bad or failure about yourself  0  ?Trouble concentrating 0  ?Moving slowly or fidgety/restless 0  ?Suicidal thoughts 0  ?PHQ-9 Score 1  ?Difficult doing work/chores Not difficult at all  ? ? ?  06/18/2021  ?  8:58 AM  ?GAD 7 : Generalized Anxiety Score  ?Nervous, Anxious, on Edge 0  ?Control/stop worrying 0  ?Worry too much - different things 0  ?Trouble relaxing 0  ?Restless 0  ?Easily annoyed or irritable 1  ?Afraid - awful might happen 1  ?Total GAD 7 Score 2  ?Anxiety Difficulty Not difficult at all  ? ? ?Past Medical History:  ?Diagnosis Date  ? Arthritis   ? ? ?History reviewed. No pertinent surgical history. ? ?Family History  ?Problem Relation Age of Onset  ? Cancer Mother   ?     pancreatic  ? Diabetes Mother   ? Hypertension Mother   ? Heart disease Maternal Uncle   ? ? ?Social History  ? ?Socioeconomic History  ? Marital status: Married  ?  Spouse name: Not on file  ? Number of children: 4  ? Years of education: Not on  file  ? Highest education level: Not on file  ?Occupational History  ? Not on file  ?Tobacco Use  ? Smoking status: Some Days  ?  Types: Cigars  ? Smokeless tobacco: Never  ?Vaping Use  ? Vaping Use: Never used  ?Substance and Sexual Activity  ? Alcohol use: Yes  ?  Alcohol/week: 4.0 standard drinks  ?  Types: 4 Cans of beer per week  ?  Comment: weekly  ? Drug use: Yes  ?  Types: Marijuana  ? Sexual activity: Yes  ?  Birth control/protection: None  ?Other Topics Concern  ? Not on file  ?Social History Narrative  ? Not on file  ? ?Social Determinants of Health  ? ?Financial Resource Strain: Not on file  ?Food Insecurity: Not on file  ?Transportation Needs: Not on file  ?Physical Activity: Not on file  ?Stress: Not on file  ?Social Connections: Not on file  ?Intimate Partner Violence: Not on file  ? ? ?ROS ?Review of Systems  ?Constitutional: Negative.   ?HENT: Negative.    ?Eyes: Negative.   ?Respiratory: Negative.    ?Cardiovascular: Negative.   ?Gastrointestinal: Negative.   ?Genitourinary: Negative.   ?Musculoskeletal:  Positive for arthralgias (shoulders, hip).  ?  Skin: Negative.   ?Neurological: Negative.   ?Psychiatric/Behavioral:    ?     Stress  ? ?Objective:  ? ?Today's Vitals: BP 125/84 (BP Location: Left Arm, Patient Position: Sitting, Cuff Size: Normal)   Pulse 85   Temp 97.8 ?F (36.6 ?C) (Temporal)   Resp 18   Ht 5' 10.5" (1.791 m)   Wt 168 lb 12.8 oz (76.6 kg)   SpO2 99%   BMI 23.88 kg/m?  ? ?Physical Exam ?Vitals and nursing note reviewed.  ?Constitutional:   ?   Appearance: Normal appearance.  ?HENT:  ?   Head: Normocephalic and atraumatic.  ?   Right Ear: Tympanic membrane, ear canal and external ear normal.  ?   Left Ear: Tympanic membrane, ear canal and external ear normal.  ?   Nose: Nose normal.  ?   Mouth/Throat:  ?   Mouth: Mucous membranes are moist.  ?   Pharynx: Oropharynx is clear.  ?Eyes:  ?   Conjunctiva/sclera: Conjunctivae normal.  ?Cardiovascular:  ?   Rate and Rhythm: Normal  rate and regular rhythm.  ?   Pulses: Normal pulses.  ?   Heart sounds: Normal heart sounds.  ?Pulmonary:  ?   Effort: Pulmonary effort is normal.  ?   Breath sounds: Normal breath sounds.  ?Abdominal:  ?   General: Bowel sounds are normal.  ?   Palpations: Abdomen is soft.  ?   Tenderness: There is no abdominal tenderness.  ?Musculoskeletal:     ?   General: Normal range of motion.  ?   Cervical back: Normal range of motion and neck supple. No tenderness.  ?   Right lower leg: No edema.  ?   Left lower leg: No edema.  ?Lymphadenopathy:  ?   Cervical: No cervical adenopathy.  ?Skin: ?   General: Skin is warm and dry.  ?Neurological:  ?   General: No focal deficit present.  ?   Mental Status: He is alert and oriented to person, place, and time.  ?   Cranial Nerves: No cranial nerve deficit.  ?   Coordination: Coordination normal.  ?   Gait: Gait normal.  ?Psychiatric:     ?   Mood and Affect: Mood normal.     ?   Behavior: Behavior normal.     ?   Thought Content: Thought content normal.     ?   Judgment: Judgment normal.  ? ? ?Assessment & Plan:  ? ?Problem List Items Addressed This Visit   ? ?  ? Musculoskeletal and Integument  ? Arthritis  ?  Chronic, mostly well controlled.  He takes Tylenol and ibuprofen as needed at home, along with lidocaine patches.  He also smokes marijuana which helps with pain and help him sleep.  We will send in meloxicam 15 mg daily as needed, take with a meal.  If he takes this medication do not take ibuprofen that day.  Follow-up if symptoms worsen or do not improve. ? ?  ?  ? Relevant Medications  ? meloxicam (MOBIC) 15 MG tablet  ?  ? Other  ? Tobacco abuse (Chronic)  ?  States that he smokes Black and milds occasionally.  Encourage complete tobacco cessation. ? ?  ?  ? ?Other Visit Diagnoses   ? ? Routine general medical examination at a health care facility    -  Primary  ? Health maintenance reviewed and updated.  Check CMP, CBC today.  Tdap updated, referral placed  for  colonoscopy.  Follow-up 1 year  ? Relevant Orders  ? CBC with Differential/Platelet  ? Comprehensive metabolic panel  ? Encounter for lipid screening for cardiovascular disease      ? Screen fasting lipid panel today  ? Relevant Orders  ? Lipid panel  ? Need for tetanus, diphtheria, and acellular pertussis (Tdap) vaccine      ? Tdap booster updated  ? Relevant Orders  ? Tdap vaccine greater than or equal to 7yo IM (Completed)  ? Screening for prostate cancer      ? Check PSA today  ? Relevant Orders  ? PSA  ? Encounter for hepatitis C screening test for low risk patient      ? Screen for hepatitis C today  ? Relevant Orders  ? Hepatitis C antibody  ? ?  ? ? ?Outpatient Encounter Medications as of 06/18/2021  ?Medication Sig  ? meloxicam (MOBIC) 15 MG tablet Take 1 tablet (15 mg total) by mouth daily as needed for pain.  ? [DISCONTINUED] albuterol (PROVENTIL) (2.5 MG/3ML) 0.083% nebulizer solution Take 3 mLs (2.5 mg total) by nebulization every 4 (four) hours as needed for wheezing or shortness of breath. (Patient not taking: Reported on 06/18/2021)  ? [DISCONTINUED] cefdinir (OMNICEF) 300 MG capsule Take 1 capsule (300 mg total) by mouth 2 (two) times daily. (Patient not taking: Reported on 06/18/2021)  ? [DISCONTINUED] predniSONE (DELTASONE) 20 MG tablet Take 2 tablets (40 mg total) by mouth daily. (Patient not taking: Reported on 06/18/2021)  ? ?No facility-administered encounter medications on file as of 06/18/2021.  ? ? ?Follow-up: Return in about 1 year (around 06/19/2022).  ? ?Charyl Dancer, NP ? ?

## 2021-06-18 ENCOUNTER — Encounter: Payer: Self-pay | Admitting: Nurse Practitioner

## 2021-06-18 ENCOUNTER — Ambulatory Visit (INDEPENDENT_AMBULATORY_CARE_PROVIDER_SITE_OTHER): Payer: 59 | Admitting: Nurse Practitioner

## 2021-06-18 VITALS — BP 125/84 | HR 85 | Temp 97.8°F | Resp 18 | Ht 70.5 in | Wt 168.8 lb

## 2021-06-18 DIAGNOSIS — Z13220 Encounter for screening for lipoid disorders: Secondary | ICD-10-CM

## 2021-06-18 DIAGNOSIS — M1990 Unspecified osteoarthritis, unspecified site: Secondary | ICD-10-CM

## 2021-06-18 DIAGNOSIS — Z0000 Encounter for general adult medical examination without abnormal findings: Secondary | ICD-10-CM | POA: Diagnosis not present

## 2021-06-18 DIAGNOSIS — Z125 Encounter for screening for malignant neoplasm of prostate: Secondary | ICD-10-CM

## 2021-06-18 DIAGNOSIS — Z136 Encounter for screening for cardiovascular disorders: Secondary | ICD-10-CM | POA: Diagnosis not present

## 2021-06-18 DIAGNOSIS — Z1159 Encounter for screening for other viral diseases: Secondary | ICD-10-CM | POA: Diagnosis not present

## 2021-06-18 DIAGNOSIS — Z23 Encounter for immunization: Secondary | ICD-10-CM | POA: Diagnosis not present

## 2021-06-18 DIAGNOSIS — Z720 Tobacco use: Secondary | ICD-10-CM

## 2021-06-18 LAB — COMPREHENSIVE METABOLIC PANEL
ALT: 11 U/L (ref 0–53)
AST: 15 U/L (ref 0–37)
Albumin: 4.5 g/dL (ref 3.5–5.2)
Alkaline Phosphatase: 57 U/L (ref 39–117)
BUN: 9 mg/dL (ref 6–23)
CO2: 22 mEq/L (ref 19–32)
Calcium: 9.2 mg/dL (ref 8.4–10.5)
Chloride: 107 mEq/L (ref 96–112)
Creatinine, Ser: 0.93 mg/dL (ref 0.40–1.50)
GFR: 96.98 mL/min (ref >60.00)
Glucose, Bld: 94 mg/dL (ref 70–99)
Potassium: 3.9 mEq/L (ref 3.5–5.1)
Sodium: 140 mEq/L (ref 135–145)
Total Bilirubin: 0.3 mg/dL (ref 0.2–1.2)
Total Protein: 6.9 g/dL (ref 6.0–8.3)

## 2021-06-18 LAB — CBC WITH DIFFERENTIAL/PLATELET
Basophils Absolute: 0 10*3/uL (ref 0.0–0.1)
Basophils Relative: 0.5 % (ref 0.0–3.0)
Eosinophils Absolute: 0.3 10*3/uL (ref 0.0–0.7)
Eosinophils Relative: 3.1 % (ref 0.0–5.0)
HCT: 42 % (ref 39.0–52.0)
Hemoglobin: 13.8 g/dL (ref 13.0–17.0)
Lymphocytes Relative: 31.5 % (ref 12.0–46.0)
Lymphs Abs: 2.7 10*3/uL (ref 0.7–4.0)
MCHC: 32.8 g/dL (ref 30.0–36.0)
MCV: 90.1 fl (ref 78.0–100.0)
Monocytes Absolute: 0.7 10*3/uL (ref 0.1–1.0)
Monocytes Relative: 8.5 % (ref 3.0–12.0)
Neutro Abs: 4.9 10*3/uL (ref 1.4–7.7)
Neutrophils Relative %: 56.4 % (ref 43.0–77.0)
Platelets: 219 10*3/uL (ref 150.0–400.0)
RBC: 4.66 Mil/uL (ref 4.22–5.81)
RDW: 15 % (ref 11.5–15.5)
WBC: 8.7 10*3/uL (ref 4.0–10.5)

## 2021-06-18 LAB — PSA: PSA: 1.32 ng/mL (ref 0.10–4.00)

## 2021-06-18 LAB — LIPID PANEL
Cholesterol: 164 mg/dL (ref 0–200)
HDL: 55.7 mg/dL (ref >39.00)
LDL Cholesterol: 87 mg/dL (ref 0–99)
NonHDL: 108.09
Total CHOL/HDL Ratio: 3
Triglycerides: 107 mg/dL (ref 0.0–149.0)
VLDL: 21.4 mg/dL (ref 0.0–40.0)

## 2021-06-18 MED ORDER — MELOXICAM 15 MG PO TABS: 15.0000 mg | ORAL_TABLET | Freq: Every day | ORAL | 2 refills | Status: DC | PRN

## 2021-06-18 NOTE — Assessment & Plan Note (Signed)
States that he smokes Black and milds occasionally.  Encourage complete tobacco cessation. ?

## 2021-06-18 NOTE — Patient Instructions (Signed)
It was great to see you! ? ?We are checking your labs today we will let you know the results via MyChart/phone. ? ?I have placed an order for a colonoscopy, you should be hearing from the GI to schedule this in the next few weeks. ? ?I sent in meloxicam to take once a day as needed for pain.  Make sure that you take this with food.  If you take this medication do not take ibuprofen as it is very similar in nature. ? ?Let's follow-up in 1 year, sooner if you have concerns. ? ?If a referral was placed today, you will be contacted for an appointment. Please note that routine referrals can sometimes take up to 3-4 weeks to process. Please call our office if you haven't heard anything after this time frame. ? ?Take care, ? ?Vance Peper, NP ? ?

## 2021-06-18 NOTE — Assessment & Plan Note (Signed)
Chronic, mostly well controlled.  He takes Tylenol and ibuprofen as needed at home, along with lidocaine patches.  He also smokes marijuana which helps with pain and help him sleep.  We will send in meloxicam 15 mg daily as needed, take with a meal.  If he takes this medication do not take ibuprofen that day.  Follow-up if symptoms worsen or do not improve. ?

## 2021-06-19 LAB — HEPATITIS C ANTIBODY
Hepatitis C Ab: NONREACTIVE
SIGNAL TO CUT-OFF: 0.16 (ref <1.00)

## 2021-09-18 ENCOUNTER — Other Ambulatory Visit: Payer: Self-pay | Admitting: Nurse Practitioner

## 2021-09-18 DIAGNOSIS — Z1211 Encounter for screening for malignant neoplasm of colon: Secondary | ICD-10-CM

## 2021-09-18 NOTE — Progress Notes (Signed)
Referral placed for colonoscopy as discussed at previous visit.

## 2021-11-29 ENCOUNTER — Ambulatory Visit (AMBULATORY_SURGERY_CENTER): Payer: Self-pay

## 2021-11-29 VITALS — Ht 70.5 in | Wt 158.0 lb

## 2021-11-29 DIAGNOSIS — Z1211 Encounter for screening for malignant neoplasm of colon: Secondary | ICD-10-CM

## 2021-11-29 MED ORDER — NA SULFATE-K SULFATE-MG SULF 17.5-3.13-1.6 GM/177ML PO SOLN: 1.0000 | ORAL | 0 refills | Status: DC

## 2021-11-29 NOTE — Progress Notes (Signed)
Acute Office Visit  Subjective:     Patient ID: Victor Floyd, male    DOB: 11-04-1970, 51 y.o.   MRN: [id number]  Chief Complaint  Patient presents with   Shoulder Pain    Pt c/o pain in both shoulders when doing in strenuous activity for several months. Pain level at 10. Pt states he is having trouble sleeping due to shoulder discomfort    HPI Patient is in today for shoulder pain for the past few months.   SHOULDER PAIN  Duration: months Involved shoulder: bilateral, however right shoulder worse than left Mechanism of injury: unknown Location: lateral Onset:gradual Severity: 10/10  Quality: sharp, aching, throbbing Frequency: constant Radiation: no Aggravating factors: lifting, movement, and sleep  Alleviating factors:  lidocaine patch, APAP, and rest  Status: worse Treatments attempted: lidocaine patch, tylenol, rest  Relief with NSAIDs?:  No NSAIDs Taken Weakness: yes Numbness:  intermittent Decreased grip strength: no Redness: no Swelling: yes Bruising: no Fevers: no  ROS See pertinent positives and negatives per HPI.     Objective:    BP 120/81   Pulse 75   Temp 98.1 F (36.7 C) (Temporal)   Wt 160 lb 12.8 oz (72.9 kg)   SpO2 100%   BMI 22.75 kg/m    Physical Exam Vitals and nursing note reviewed.  Constitutional:      Appearance: Normal appearance.  HENT:     Head: Normocephalic.  Eyes:     Conjunctiva/sclera: Conjunctivae normal.  Pulmonary:     Effort: Pulmonary effort is normal.  Musculoskeletal:        General: Swelling and tenderness present.     Cervical back: Normal range of motion.     Comments: Limited ROM to shoulders, right more than left  Skin:    General: Skin is warm.  Neurological:     General: No focal deficit present.     Mental Status: He is alert and oriented to person, place, and time.  Psychiatric:        Mood and Affect: Mood normal.        Behavior: Behavior normal.        Thought Content: Thought  content normal.        Judgment: Judgment normal.       Assessment & Plan:   Problem List Items Addressed This Visit       Other   Chronic pain of both shoulders - Primary    Chronic, worsening. His right shoulder is worse than the left. There is swelling and limited ROM on exam. No weakness in grip strength, however empty can test weak on right. Concern for possible tear in right shoulder. Will check x-ray of both shoulders and place referral to orthopedics. Start meloxciam '15mg'$  daily with food. He did not pick up the meloxicam prescription in April. Will also start flexeril '10mg'$  as needed for muscle tightness.       Relevant Medications   meloxicam (MOBIC) 15 MG tablet   cyclobenzaprine (FLEXERIL) 10 MG tablet   Other Relevant Orders   DG Shoulder Left   DG Shoulder Right   Ambulatory referral to Orthopedic Surgery    Meds ordered this encounter  Medications   meloxicam (MOBIC) 15 MG tablet    Sig: Take 1 tablet (15 mg total) by mouth daily as needed for pain.    Dispense:  30 tablet    Refill:  2   cyclobenzaprine (FLEXERIL) 10 MG tablet    Sig: Take 1 tablet (  10 mg total) by mouth 3 (three) times daily as needed for muscle spasms.    Dispense:  30 tablet    Refill:  0    Return in about 3 months (around 03/01/2022) for shoulder pain.  Charyl Dancer, NP

## 2021-11-29 NOTE — Progress Notes (Signed)
No egg or soy allergy known to patient  No issues known to pt with past sedation with any surgeries or procedures Patient denies ever being told they had issues or difficulty with intubation  No FH of Malignant Hyperthermia Pt is not on diet pills Pt is not on  home 02  Pt is not on blood thinners  Pt denies issues with constipation  No A fib or A flutter Have any cardiac testing pending--denied Pt instructed to use [employer] or GoodRx for a price reduction on prep   

## 2021-11-30 ENCOUNTER — Ambulatory Visit: Payer: 59

## 2021-11-30 ENCOUNTER — Encounter: Payer: Self-pay | Admitting: Nurse Practitioner

## 2021-11-30 ENCOUNTER — Ambulatory Visit (INDEPENDENT_AMBULATORY_CARE_PROVIDER_SITE_OTHER): Payer: 59 | Admitting: Nurse Practitioner

## 2021-11-30 ENCOUNTER — Ambulatory Visit (INDEPENDENT_AMBULATORY_CARE_PROVIDER_SITE_OTHER): Payer: 59

## 2021-11-30 VITALS — BP 120/81 | HR 75 | Temp 98.1°F | Wt 160.8 lb

## 2021-11-30 DIAGNOSIS — M25511 Pain in right shoulder: Secondary | ICD-10-CM

## 2021-11-30 DIAGNOSIS — G8929 Other chronic pain: Secondary | ICD-10-CM

## 2021-11-30 DIAGNOSIS — M25512 Pain in left shoulder: Secondary | ICD-10-CM | POA: Diagnosis not present

## 2021-11-30 MED ORDER — CYCLOBENZAPRINE HCL 10 MG PO TABS: 10.0000 mg | ORAL_TABLET | Freq: Three times a day (TID) | ORAL | 0 refills | Status: AC | PRN

## 2021-11-30 MED ORDER — MELOXICAM 15 MG PO TABS: 15.0000 mg | ORAL_TABLET | Freq: Every day | ORAL | 2 refills | Status: DC | PRN

## 2021-11-30 NOTE — Patient Instructions (Signed)
It was great to see you!  Start meloxicam 1 tablet daily with food for pain and inflammation.   You can also take flexeril 1 tablet as needed for muscle tightness, spasms. This can make you sleepy.   We are checking x-rays of your shoulders and I am placing a referral to orthopedics. They will call to schedule.   Let's follow-up in 3 months, sooner if you have concerns.  If a referral was placed today, you will be contacted for an appointment. Please note that routine referrals can sometimes take up to 3-4 weeks to process. Please call our office if you haven't heard anything after this time frame.  Take care,  Vance Peper, NP

## 2021-11-30 NOTE — Addendum Note (Signed)
Addended by: Vance Peper A on: 11/30/2021 09:16 AM   Modules accepted: Orders

## 2021-11-30 NOTE — Progress Notes (Unsigned)
Initial visit for chronic bilateral shoulder pain x 6 months. Denies injury.

## 2021-11-30 NOTE — Addendum Note (Signed)
Addended by: Lynnea Ferrier on: 11/30/2021 10:19 AM   Modules accepted: Orders

## 2021-11-30 NOTE — Assessment & Plan Note (Signed)
Chronic, worsening. His right shoulder is worse than the left. There is swelling and limited ROM on exam. No weakness in grip strength, however empty can test weak on right. Concern for possible tear in right shoulder. Will check x-ray of both shoulders and place referral to orthopedics. Start meloxciam '15mg'$  daily with food. He did not pick up the meloxicam prescription in April. Will also start flexeril '10mg'$  as needed for muscle tightness.

## 2021-12-05 ENCOUNTER — Ambulatory Visit: Payer: 59 | Admitting: Orthopaedic Surgery

## 2021-12-11 ENCOUNTER — Encounter: Payer: Self-pay | Admitting: Gastroenterology

## 2021-12-15 ENCOUNTER — Encounter: Payer: Self-pay | Admitting: Certified Registered Nurse Anesthetist

## 2021-12-19 ENCOUNTER — Telehealth: Payer: Self-pay | Admitting: Gastroenterology

## 2021-12-19 NOTE — Telephone Encounter (Signed)
Sorry to hear this thanks for letting me know

## 2021-12-19 NOTE — Telephone Encounter (Signed)
Patent called requesting to reschedule his procedure for tomorrow due to family emergency out of town rescheduled for 01/01/22.

## 2021-12-20 ENCOUNTER — Encounter: Payer: 59 | Admitting: Gastroenterology

## 2022-01-01 ENCOUNTER — Ambulatory Visit (AMBULATORY_SURGERY_CENTER): Payer: 59 | Admitting: Gastroenterology

## 2022-01-01 ENCOUNTER — Encounter: Payer: Self-pay | Admitting: Gastroenterology

## 2022-01-01 VITALS — BP 126/75 | HR 58 | Temp 98.7°F | Resp 20 | Ht 70.5 in | Wt 158.0 lb

## 2022-01-01 DIAGNOSIS — K635 Polyp of colon: Secondary | ICD-10-CM

## 2022-01-01 DIAGNOSIS — D122 Benign neoplasm of ascending colon: Secondary | ICD-10-CM

## 2022-01-01 DIAGNOSIS — D127 Benign neoplasm of rectosigmoid junction: Secondary | ICD-10-CM

## 2022-01-01 DIAGNOSIS — Z1211 Encounter for screening for malignant neoplasm of colon: Secondary | ICD-10-CM

## 2022-01-01 DIAGNOSIS — D124 Benign neoplasm of descending colon: Secondary | ICD-10-CM

## 2022-01-01 DIAGNOSIS — D123 Benign neoplasm of transverse colon: Secondary | ICD-10-CM

## 2022-01-01 MED ORDER — SODIUM CHLORIDE 0.9 % IV SOLN: 500.0000 mL | Freq: Once | INTRAVENOUS | Status: AC

## 2022-01-01 NOTE — Op Note (Signed)
[place] Patient Name: Victor Floyd Procedure Date: 01/01/2022 9:41 AM MRN: [id number] Endoscopist: Remo Lipps P. Havery Moros , MD, [id number] Age: 51 Referring MD:  Date of Birth: 19-Apr-1970 Gender: Male Account #: [id number] Procedure:                Colonoscopy Indications:              Screening for colorectal malignant neoplasm, This                            is the patient's first colonoscopy Medicines:                Monitored Anesthesia Care Procedure:                Pre-Anesthesia Assessment:                           - Prior to the procedure, a History and Physical                            was performed, and patient medications and                            allergies were reviewed. The patient's tolerance of                            previous anesthesia was also reviewed. The risks                            and benefits of the procedure and the sedation                            options and risks were discussed with the patient.                            All questions were answered, and informed consent                            was obtained. Prior Anticoagulants: The patient has                            taken no anticoagulant or antiplatelet agents. ASA                            Grade Assessment: II - A patient with mild systemic                            disease. After reviewing the risks and benefits,                            the patient was deemed in satisfactory condition to                            undergo the procedure.  After obtaining informed consent, the colonoscope                            was passed under direct vision. Throughout the                            procedure, the patient's blood pressure, pulse, and                            oxygen saturations were monitored continuously. The                            CF [id number] #[id number] was introduced through the anus                            and advanced to  the the cecum, identified by                            appendiceal orifice and ileocecal valve. The                            colonoscopy was performed without difficulty. The                            patient tolerated the procedure well. The quality                            of the bowel preparation was good. The ileocecal                            valve, appendiceal orifice, and rectum were                            photographed. Scope In: 9:45:01 AM Scope Out: 10:05:14 AM Scope Withdrawal Time: 0 hours 15 minutes 34 seconds  Total Procedure Duration: 0 hours 20 minutes 13 seconds  Findings:                 The perianal and digital rectal examinations were                            normal.                           A 3 to 4 mm polyp was found in the ascending colon.                            The polyp was flat. The polyp was removed with a                            cold snare. Resection and retrieval were complete.                           A 3 to 4 mm polyp was found in the transverse  colon. The polyp was flat. The polyp was removed                            with a cold snare. Resection and retrieval were                            complete.                           A 3 mm polyp was found in the descending colon. The                            polyp was sessile. The polyp was removed with a                            cold snare. Resection was complete, but the polyp                            tissue was not retrieved.                           A 3 mm polyp was found in the recto-sigmoid colon.                            The polyp was sessile. The polyp was removed with a                            cold snare. Resection and retrieval were complete.                           A few small-mouthed diverticula were found in the                            sigmoid colon and ascending colon.                           Internal hemorrhoids were found during                             retroflexion. The hemorrhoids were small.                           The exam was otherwise without abnormality. Complications:            No immediate complications. Estimated blood loss:                            Minimal. Estimated Blood Loss:     Estimated blood loss was minimal. Impression:               - One 3 to 4 mm polyp in the ascending colon,                            removed with a cold snare. Resected and retrieved.                           -  One 3 to 4 mm polyp in the transverse colon,                            removed with a cold snare. Resected and retrieved.                           - One 3 mm polyp in the descending colon, removed                            with a cold snare. Complete resection. Polyp tissue                            not retrieved.                           - One 3 mm polyp at the recto-sigmoid colon,                            removed with a cold snare. Resected and retrieved.                           - Diverticulosis in the sigmoid colon and in the                            ascending colon.                           - Internal hemorrhoids.                           - The examination was otherwise normal. Recommendation:           - Patient has a contact number available for                            emergencies. The signs and symptoms of potential                            delayed complications were discussed with the                            patient. Return to normal activities tomorrow.                            Written discharge instructions were provided to the                            patient.                           - Resume previous diet.                           - Continue present medications.                           -  Await pathology results. Remo Lipps P. Parnell Spieler, MD 01/01/2022 10:10:56 AM This report has been signed electronically.

## 2022-01-01 NOTE — Patient Instructions (Signed)
Information on polyps and diverticulosis given to you today.  Await pathology results.  Resume previous diet and medications.   YOU HAD AN ENDOSCOPIC PROCEDURE TODAY AT THE [facility]:   Refer to the procedure report that was given to you for any specific questions about what was found during the examination.  If the procedure report does not answer your questions, please call your gastroenterologist to clarify.  If you requested that your care partner not be given the details of your procedure findings, then the procedure report has been included in a sealed envelope for you to review at your convenience later.  YOU SHOULD EXPECT: Some feelings of bloating in the abdomen. Passage of more gas than usual.  Walking can help get rid of the air that was put into your GI tract during the procedure and reduce the bloating. If you had a lower endoscopy (such as a colonoscopy or flexible sigmoidoscopy) you may notice spotting of blood in your stool or on the toilet paper. If you underwent a bowel prep for your procedure, you may not have a normal bowel movement for a few days.  Please Note:  You might notice some irritation and congestion in your nose or some drainage.  This is from the oxygen used during your procedure.  There is no need for concern and it should clear up in a day or so.  SYMPTOMS TO REPORT IMMEDIATELY:  Following lower endoscopy (colonoscopy or flexible sigmoidoscopy):  Excessive amounts of blood in the stool  Significant tenderness or worsening of abdominal pains  Swelling of the abdomen that is new, acute  Fever of 100F or higher  For urgent or emergent issues, a gastroenterologist can be reached at any hour by calling [phone]. Do not use MyChart messaging for urgent concerns.    DIET:  We do recommend a small meal at first, but then you may proceed to your regular diet.  Drink plenty of fluids but you should avoid alcoholic beverages for 24  hours.  ACTIVITY:  You should plan to take it easy for the rest of today and you should NOT DRIVE or use heavy machinery until tomorrow (because of the sedation medicines used during the test).    FOLLOW UP: Our staff will call the number listed on your records the next business day following your procedure.  We will call around 7:15- 8:00 am to check on you and address any questions or concerns that you may have regarding the information given to you following your procedure. If we do not reach you, we will leave a message.     If any biopsies were taken you will be contacted by phone or by letter within the next 1-3 weeks.  Please call us at [phone] if you have not heard about the biopsies in 3 weeks.    SIGNATURES/CONFIDENTIALITY: You and/or your care partner have signed paperwork which will be entered into your electronic medical record.  These signatures attest to the fact that that the information above on your After Visit Summary has been reviewed and is understood.  Full responsibility of the confidentiality of this discharge information lies with you and/or your care-partner. 

## 2022-01-01 NOTE — Progress Notes (Signed)
[facility] History and Physical   Primary Care Physician:  Charyl Dancer, NP   Reason for Procedure:   Colon cancer screening  Plan:    colonoscopy     HPI: Victor Floyd is a 51 y.o. male  here for colonoscopy screening - first time exam.   Patient denies any bowel symptoms at this time on a routine basis. Mild rectal bleeding in the past. No family history of colon cancer known. Otherwise feels well without any cardiopulmonary symptoms.   I have discussed risks / benefits of anesthesia and endoscopic procedure with Carollee Herter and they wish to proceed with the exams as outlined today.    Past Medical History:  Diagnosis Date   Arthritis    Asthma    years ago, no longer requiring inhaler   Stroke [facility])    TIA 17yr ago, no residual symptoms  (?Bells Palsy)    History reviewed. No pertinent surgical history.  Prior to Admission medications   Medication Sig Start Date End Date Taking? Authorizing Provider  cyclobenzaprine (FLEXERIL) 10 MG tablet Take 1 tablet (10 mg total) by mouth 3 (three) times daily as needed for muscle spasms. 11/30/21  Yes McElwee, Lauren A, NP  meloxicam (MOBIC) 15 MG tablet Take 1 tablet (15 mg total) by mouth daily as needed for pain. 11/30/21  Yes McElwee, Lauren A, NP    Current Outpatient Medications  Medication Sig Dispense Refill   cyclobenzaprine (FLEXERIL) 10 MG tablet Take 1 tablet (10 mg total) by mouth 3 (three) times daily as needed for muscle spasms. 30 tablet 0   meloxicam (MOBIC) 15 MG tablet Take 1 tablet (15 mg total) by mouth daily as needed for pain. 30 tablet 2   Current Facility-Administered Medications  Medication Dose Route Frequency Provider Last Rate Last Admin   0.9 %  sodium chloride infusion  500 mL Intravenous Once Carman Auxier, SCarlota Raspberry MD        Allergies as of 01/01/2022   (No Known Allergies)    Family History  Problem Relation Age of Onset   Cancer Mother        pancreatic    Diabetes Mother    Hypertension Mother    Heart disease Maternal Uncle    Colon cancer Neg Hx    Colon polyps Neg Hx    Esophageal cancer Neg Hx    Stomach cancer Neg Hx    Rectal cancer Neg Hx     Social History   Socioeconomic History   Marital status: Married    Spouse name: Not on file   Number of children: 4   Years of education: Not on file   Highest education level: Not on file  Occupational History   Not on file  Tobacco Use   Smoking status: Some Days    Types: Cigars   Smokeless tobacco: Never  Vaping Use   Vaping Use: Never used  Substance and Sexual Activity   Alcohol use: Yes    Alcohol/week: 4.0 standard drinks of alcohol    Types: 4 Cans of beer per week    Comment: weekly   Drug use: Yes    Types: Marijuana   Sexual activity: Yes    Birth control/protection: None  Other Topics Concern   Not on file  Social History Narrative   Not on file   Social Determinants of Health   Financial Resource Strain: Not on file  Food Insecurity: Not on file  Transportation Needs: Not on  file  Physical Activity: Not on file  Stress: Not on file  Social Connections: Not on file  Intimate Partner Violence: Not on file    Review of Systems: All other review of systems negative except as mentioned in the HPI.  Physical Exam: Vital signs BP 120/71   Pulse 78   Temp 98.7 F (37.1 C)   Ht 5' 10.5" (1.791 m)   Wt 158 lb (71.7 kg)   SpO2 100%   BMI 22.35 kg/m   General:   Alert,  Well-developed, pleasant and cooperative in NAD Lungs:  Clear throughout to auscultation.   Heart:  Regular rate and rhythm Abdomen:  Soft, nontender and nondistended.   Neuro/Psych:  Alert and cooperative. Normal mood and affect. A and O x 3  Jolly Mango, MD [facility]

## 2022-01-01 NOTE — Progress Notes (Signed)
Sedate, gd SR, tolerated procedure well, VSS, report to RN 

## 2022-01-01 NOTE — Progress Notes (Signed)
Called to room to assist during endoscopic procedure.  Patient ID and intended procedure confirmed with present staff. Received instructions for my participation in the procedure from the performing physician.  

## 2022-01-01 NOTE — Progress Notes (Signed)
Pt's states no medical or surgical changes since previsit or office visit. 

## 2022-01-02 ENCOUNTER — Telehealth: Payer: Self-pay

## 2022-01-02 NOTE — Telephone Encounter (Signed)
Mailbox is full.

## 2022-03-01 ENCOUNTER — Ambulatory Visit: Payer: 59 | Admitting: Nurse Practitioner

## 2022-03-01 ENCOUNTER — Telehealth: Payer: Self-pay | Admitting: Nurse Practitioner

## 2022-03-01 NOTE — Telephone Encounter (Signed)
Pt called afterhours to cancel 12/29 OV with Lauren. This is her first, letter sent

## 2022-03-08 NOTE — Telephone Encounter (Signed)
late cancel/called after hours to cancel, 1st no show, fee waived, letter sent

## 2022-03-08 NOTE — Telephone Encounter (Signed)
Noted  

## 2022-06-20 ENCOUNTER — Encounter: Payer: Self-pay | Admitting: Nurse Practitioner

## 2022-06-20 ENCOUNTER — Ambulatory Visit (INDEPENDENT_AMBULATORY_CARE_PROVIDER_SITE_OTHER): Payer: 59 | Admitting: Nurse Practitioner

## 2022-06-20 VITALS — BP 112/80 | HR 71 | Temp 97.3°F | Ht 71.0 in | Wt 164.6 lb

## 2022-06-20 DIAGNOSIS — M25511 Pain in right shoulder: Secondary | ICD-10-CM

## 2022-06-20 DIAGNOSIS — G8929 Other chronic pain: Secondary | ICD-10-CM

## 2022-06-20 DIAGNOSIS — Z136 Encounter for screening for cardiovascular disorders: Secondary | ICD-10-CM | POA: Diagnosis not present

## 2022-06-20 DIAGNOSIS — M25512 Pain in left shoulder: Secondary | ICD-10-CM

## 2022-06-20 DIAGNOSIS — F17290 Nicotine dependence, other tobacco product, uncomplicated: Secondary | ICD-10-CM | POA: Diagnosis not present

## 2022-06-20 DIAGNOSIS — Z0000 Encounter for general adult medical examination without abnormal findings: Secondary | ICD-10-CM

## 2022-06-20 DIAGNOSIS — Z125 Encounter for screening for malignant neoplasm of prostate: Secondary | ICD-10-CM

## 2022-06-20 NOTE — Assessment & Plan Note (Signed)
Chronic, ongoing.  He has limited range of motion due to pain.  He has tenderness with slight palpation around his general shoulder.  He was unable to go to orthopedics last visit due to car issues.  Will place a new referral to orthopedics today.  Continue meloxicam 15 mg daily as needed with food.  Encouraged him to reach out if he does not hear from orthopedics to schedule an appointment.

## 2022-06-20 NOTE — Assessment & Plan Note (Signed)
Cutting back, smoking about 5 per week.

## 2022-06-20 NOTE — Progress Notes (Signed)
BP 112/80 (BP Location: Left Arm)   Pulse 71   Temp (!) 97.3 F (36.3 C)   Ht 5\' 11"  (1.803 m)   Wt 164 lb 9.6 oz (74.7 kg)   SpO2 97%   BMI 22.96 kg/m    Subjective:    Patient ID: Victor Floyd, male    DOB: Oct 06, 1970, 52 y.o.   MRN: [id number]  CC: Chief Complaint  Patient presents with   Annual Exam    Concerns with arthritis in both shoulder    HPI: Victor Floyd is a 52 y.o. male presenting on 06/20/2022 for comprehensive medical examination. Current medical complaints include: ongoing chronic shoulder pain  He states he is still having ongoing bilateral chronic shoulder pain.  The pain has gotten worse over the last few months.  He is still doing a lot of heavy lifting and overhead lifting for his job.  He is having trouble sleeping at night.  He states the pain was worse in the right arm and now it is getting just as bad in his left shoulder.  It is also causing his neck to slightly hurt as well.  He has been taking the meloxicam, however he feels like it is not helping as much anymore.  He states that he was having some car issues when we put the first referral to orthopedics and and was unable to go and would like another referral today.  Depression and Anxiety Screen done today and results listed below:     06/20/2022    8:28 AM 06/18/2021    8:57 AM  Depression screen PHQ 2/9  Decreased Interest 0 0  Down, Depressed, Hopeless 0 1  PHQ - 2 Score 0 1  Altered sleeping 3 0  Tired, decreased energy 0 0  Change in appetite 0 0  Feeling bad or failure about yourself  0 0  Trouble concentrating 0 0  Moving slowly or fidgety/restless 0 0  Suicidal thoughts 0 0  PHQ-9 Score 3 1  Difficult doing work/chores Very difficult Not difficult at all      06/20/2022    8:29 AM 06/18/2021    8:58 AM  GAD 7 : Generalized Anxiety Score  Nervous, Anxious, on Edge 0 0  Control/stop worrying 0 0  Worry too much - different things 0 0  Trouble relaxing 0 0  Restless 0 0   Easily annoyed or irritable 0 1  Afraid - awful might happen 0 1  Total GAD 7 Score 0 2  Anxiety Difficulty  Not difficult at all    The patient does not have a history of falls. I did not complete a risk assessment for falls. A plan of care for falls was not documented.   Past Medical History:  Past Medical History:  Diagnosis Date   Arthritis    Asthma    years ago, no longer requiring inhaler   Stroke    TIA 57yrs ago, no residual symptoms  (?Bells Palsy)    Surgical History:  History reviewed. No pertinent surgical history.  Medications:  Current Outpatient Medications on File Prior to Visit  Medication Sig   cyclobenzaprine (FLEXERIL) 10 MG tablet Take 1 tablet (10 mg total) by mouth 3 (three) times daily as needed for muscle spasms.   meloxicam (MOBIC) 15 MG tablet Take 1 tablet (15 mg total) by mouth daily as needed for pain.   Current Facility-Administered Medications on File Prior to Visit  Medication   0.9 %  sodium chloride infusion    Allergies:  No Known Allergies  Social History:  Social History   Socioeconomic History   Marital status: Married    Spouse name: Not on file   Number of children: 4   Years of education: Not on file   Highest education level: Not on file  Occupational History   Not on file  Tobacco Use   Smoking status: Some Days    Types: Cigars   Smokeless tobacco: Never  Vaping Use   Vaping Use: Never used  Substance and Sexual Activity   Alcohol use: Yes    Alcohol/week: 4.0 standard drinks of alcohol    Types: 4 Cans of beer per week    Comment: weekly   Drug use: Yes    Types: Marijuana   Sexual activity: Yes    Birth control/protection: None  Other Topics Concern   Not on file  Social History Narrative   Not on file   Social Determinants of Health   Financial Resource Strain: Not on file  Food Insecurity: Not on file  Transportation Needs: Not on file  Physical Activity: Not on file  Stress: Not on file   Social Connections: Not on file  Intimate Partner Violence: Not on file   Social History   Tobacco Use  Smoking Status Some Days   Types: Cigars  Smokeless Tobacco Never   Social History   Substance and Sexual Activity  Alcohol Use Yes   Alcohol/week: 4.0 standard drinks of alcohol   Types: 4 Cans of beer per week   Comment: weekly    Family History:  Family History  Problem Relation Age of Onset   Cancer Mother        pancreatic   Diabetes Mother    Hypertension Mother    Heart disease Maternal Uncle    Colon cancer Neg Hx    Colon polyps Neg Hx    Esophageal cancer Neg Hx    Stomach cancer Neg Hx    Rectal cancer Neg Hx     Past medical history, surgical history, medications, allergies, family history and social history reviewed with patient today and changes made to appropriate areas of the chart.   Review of Systems  Constitutional: Negative.   HENT: Negative.    Eyes: Negative.   Respiratory: Negative.    Cardiovascular: Negative.   Gastrointestinal: Negative.   Genitourinary: Negative.   Musculoskeletal:  Positive for joint pain (bilateral shoulders).  Skin: Negative.   Neurological: Negative.   Psychiatric/Behavioral: Negative.     All other ROS negative except what is listed above and in the HPI.      Objective:    BP 112/80 (BP Location: Left Arm)   Pulse 71   Temp (!) 97.3 F (36.3 C)   Ht 5\' 11"  (1.803 m)   Wt 164 lb 9.6 oz (74.7 kg)   SpO2 97%   BMI 22.96 kg/m   Wt Readings from Last 3 Encounters:  06/20/22 164 lb 9.6 oz (74.7 kg)  01/01/22 158 lb (71.7 kg)  11/30/21 160 lb 12.8 oz (72.9 kg)    Physical Exam Vitals and nursing note reviewed.  Constitutional:      General: He is not in acute distress.    Appearance: Normal appearance.  HENT:     Head: Normocephalic and atraumatic.     Right Ear: Tympanic membrane, ear canal and external ear normal.     Left Ear: Tympanic membrane, ear canal and external ear normal.  Mouth/Throat:     Mouth: Mucous membranes are moist.     Pharynx: Oropharynx is clear. No oropharyngeal exudate or posterior oropharyngeal erythema.  Eyes:     Conjunctiva/sclera: Conjunctivae normal.  Cardiovascular:     Rate and Rhythm: Normal rate and regular rhythm.     Pulses: Normal pulses.     Heart sounds: Normal heart sounds.  Pulmonary:     Effort: Pulmonary effort is normal.     Breath sounds: Normal breath sounds.  Abdominal:     Palpations: Abdomen is soft.     Tenderness: There is no abdominal tenderness.  Musculoskeletal:        General: Tenderness present. No swelling.     Cervical back: Normal range of motion and neck supple. No tenderness.     Right lower leg: No edema.     Left lower leg: No edema.     Comments: Bilateral shoulder ROM decreased due to pain  Lymphadenopathy:     Cervical: No cervical adenopathy.  Skin:    General: Skin is warm and dry.  Neurological:     General: No focal deficit present.     Mental Status: He is alert and oriented to person, place, and time.     Cranial Nerves: No cranial nerve deficit.     Coordination: Coordination normal.     Gait: Gait normal.  Psychiatric:        Mood and Affect: Mood normal.        Behavior: Behavior normal.        Thought Content: Thought content normal.        Judgment: Judgment normal.     Results for orders placed or performed in visit on 06/18/21  CBC with Differential/Platelet  Result Value Ref Range   WBC 8.7 4.0 - 10.5 K/uL   RBC 4.66 4.22 - 5.81 Mil/uL   Hemoglobin 13.8 13.0 - 17.0 g/dL   HCT 78.2 95.6 - 21.3 %   MCV 90.1 78.0 - 100.0 fl   MCHC 32.8 30.0 - 36.0 g/dL   RDW 08.6 57.8 - 46.9 %   Platelets 219.0 150.0 - 400.0 K/uL   Neutrophils Relative % 56.4 43.0 - 77.0 %   Lymphocytes Relative 31.5 12.0 - 46.0 %   Monocytes Relative 8.5 3.0 - 12.0 %   Eosinophils Relative 3.1 0.0 - 5.0 %   Basophils Relative 0.5 0.0 - 3.0 %   Neutro Abs 4.9 1.4 - 7.7 K/uL   Lymphs Abs 2.7 0.7 -  4.0 K/uL   Monocytes Absolute 0.7 0.1 - 1.0 K/uL   Eosinophils Absolute 0.3 0.0 - 0.7 K/uL   Basophils Absolute 0.0 0.0 - 0.1 K/uL  Comprehensive metabolic panel  Result Value Ref Range   Sodium 140 135 - 145 mEq/L   Potassium 3.9 3.5 - 5.1 mEq/L   Chloride 107 96 - 112 mEq/L   CO2 22 19 - 32 mEq/L   Glucose, Bld 94 70 - 99 mg/dL   BUN 9 6 - 23 mg/dL   Creatinine, Ser 6.29 0.40 - 1.50 mg/dL   Total Bilirubin 0.3 0.2 - 1.2 mg/dL   Alkaline Phosphatase 57 39 - 117 U/L   AST 15 0 - 37 U/L   ALT 11 0 - 53 U/L   Total Protein 6.9 6.0 - 8.3 g/dL   Albumin 4.5 3.5 - 5.2 g/dL   GFR 52.84 >13.24 mL/min   Calcium 9.2 8.4 - 10.5 mg/dL  Lipid panel  Result Value Ref Range  Cholesterol 164 0 - 200 mg/dL   Triglycerides 161.0 0.0 - 149.0 mg/dL   HDL 96.04 >54.09 mg/dL   VLDL 81.1 0.0 - 91.4 mg/dL   LDL Cholesterol 87 0 - 99 mg/dL   Total CHOL/HDL Ratio 3    NonHDL 108.09   PSA  Result Value Ref Range   PSA 1.32 0.10 - 4.00 ng/mL  Hepatitis C antibody  Result Value Ref Range   Hepatitis C Ab NON-REACTIVE NON-REACTIVE   SIGNAL TO CUT-OFF 0.16 <1.00      Assessment & Plan:   Problem List Items Addressed This Visit       Other   Chronic pain of both shoulders    Chronic, ongoing.  He has limited range of motion due to pain.  He has tenderness with slight palpation around his general shoulder.  He was unable to go to orthopedics last visit due to car issues.  Will place a new referral to orthopedics today.  Continue meloxicam 15 mg daily as needed with food.  Encouraged him to reach out if he does not hear from orthopedics to schedule an appointment.      Relevant Orders   Ambulatory referral to Orthopedic Surgery   Cigar smoker    Cutting back, smoking about 5 per week.       Routine general medical examination at a health care facility - Primary   Relevant Orders   CBC with Differential/Platelet   Comprehensive metabolic panel   TSH   Other Visit Diagnoses     Screening  for cardiovascular condition       Screen lipid panel today   Relevant Orders   Lipid panel   Screening PSA (prostate specific antigen)       Screen PSA today   Relevant Orders   PSA        LABORATORY TESTING:  Health maintenance labs ordered today as discussed above.   The natural history of prostate cancer and ongoing controversy regarding screening and potential treatment outcomes of prostate cancer has been discussed with the patient. The meaning of a false positive PSA and a false negative PSA has been discussed. He indicates understanding of the limitations of this screening test and wishes to proceed with screening PSA testing.   IMMUNIZATIONS:   - Tdap: Tetanus vaccination status reviewed: last tetanus booster within 10 years. - Influenza: Postponed to flu season - Pneumovax: Not applicable - Prevnar: Not applicable - HPV: Not applicable - Zostavax vaccine: Not applicable  SCREENING: - Colonoscopy: Up to date  Discussed with patient purpose of the colonoscopy is to detect colon cancer at curable precancerous or early stages   - AAA Screening: Not applicable  -Hearing Test: Not applicable  -Spirometry: Not applicable   PATIENT COUNSELING:    Sexuality: Discussed sexually transmitted diseases, partner selection, use of condoms, avoidance of unintended pregnancy  and contraceptive alternatives.   Advised to avoid cigarette smoking.  I discussed with the patient that most people either abstain from alcohol or drink within safe limits (<=14/week and <=4 drinks/occasion for males, <=7/weeks and <= 3 drinks/occasion for females) and that the risk for alcohol disorders and other health effects rises proportionally with the number of drinks per week and how often a drinker exceeds daily limits.  Discussed cessation/primary prevention of drug use and availability of treatment for abuse.   Diet: Encouraged to adjust caloric intake to maintain  or achieve ideal body weight, to  reduce intake of dietary saturated fat and total fat,  to limit sodium intake by avoiding high sodium foods and not adding table salt, and to maintain adequate dietary potassium and calcium preferably from fresh fruits, vegetables, and low-fat dairy products.    stressed the importance of regular exercise  Injury prevention: Discussed safety belts, safety helmets, smoke detector, smoking near bedding or upholstery.   Dental health: Discussed importance of regular tooth brushing, flossing, and dental visits.   Follow up plan: NEXT PREVENTATIVE PHYSICAL DUE IN 1 YEAR. Return in about 1 year (around 06/20/2023) for CPE.

## 2022-06-20 NOTE — Patient Instructions (Signed)
It was great to see you!  I have placed a referral to orthopedics.   We are going to check labs, you can come back this afternoon or a different morning when able.   Let's follow-up in 1 year, sooner if you have concerns.  If a referral was placed today, you will be contacted for an appointment. Please note that routine referrals can sometimes take up to 3-4 weeks to process. Please call our office if you haven't heard anything after this time frame.  Take care,  Rodman Pickle, NP

## 2022-06-28 ENCOUNTER — Encounter: Payer: Self-pay | Admitting: Physician Assistant

## 2022-06-28 ENCOUNTER — Ambulatory Visit: Payer: 59 | Admitting: Physician Assistant

## 2022-06-28 ENCOUNTER — Other Ambulatory Visit (INDEPENDENT_AMBULATORY_CARE_PROVIDER_SITE_OTHER): Payer: 59

## 2022-06-28 DIAGNOSIS — R202 Paresthesia of skin: Secondary | ICD-10-CM | POA: Diagnosis not present

## 2022-06-28 DIAGNOSIS — M542 Cervicalgia: Secondary | ICD-10-CM | POA: Diagnosis not present

## 2022-06-28 DIAGNOSIS — M25512 Pain in left shoulder: Secondary | ICD-10-CM

## 2022-06-28 DIAGNOSIS — G8929 Other chronic pain: Secondary | ICD-10-CM

## 2022-06-28 DIAGNOSIS — M25511 Pain in right shoulder: Secondary | ICD-10-CM

## 2022-06-28 MED ORDER — PREDNISONE 10 MG (21) PO TBPK: ORAL_TABLET | ORAL | 0 refills | Status: AC

## 2022-06-28 MED ORDER — METHOCARBAMOL 750 MG PO TABS: 750.0000 mg | ORAL_TABLET | Freq: Two times a day (BID) | ORAL | 0 refills | Status: AC | PRN

## 2022-06-28 NOTE — Progress Notes (Signed)
Office Visit Note   Patient: Victor Floyd           Date of Birth: 05/24/1970           MRN: [id number] Visit Date: 06/28/2022              Requested by: Gerre Scull, NP [address] [place],  [place] [address] PCP: Gerre Scull, NP   Assessment & Plan: Visit Diagnoses:  1. Chronic pain of both shoulders   2. Neck pain   3. Paresthesia of hand, bilateral     Plan: Impression is chronic bilateral shoulder pain, neck pain and hand paresthesias.  I believe the patient is experiencing symptoms radiating not only from his neck but from his shoulders.  We have discussed starting him on a steroid taper in addition to changing his muscle relaxer versus cortisone injections to the shoulders.  He would like to first try the oral medications.  These have been sent in.  I believe he is in too much pain for physical therapy at this time.  In regards to the hand paresthesias, this could be coming from his neck but I would like to order a nerve conduction study to rule out carpal tunnel syndrome.  Referrals been made to Dr. Alvester Morin.  He will follow-up with [place] once these have been completed.  Call with concerns or questions in the meantime.  Follow-Up Instructions: Return for f/u after NCS.   Orders:  Orders Placed This Encounter  Procedures   XR Cervical Spine 2 or 3 views   Meds ordered this encounter  Medications   methocarbamol (ROBAXIN-750) 750 MG tablet    Sig: Take 1 tablet (750 mg total) by mouth 2 (two) times daily as needed for muscle spasms.    Dispense:  20 tablet    Refill:  0   predniSONE (STERAPRED UNI-PAK 21 TAB) 10 MG (21) TBPK tablet    Sig: Take as directed    Dispense:  21 tablet    Refill:  0      Procedures: No procedures performed   Clinical Data: No additional findings.   Subjective: Chief Complaint  Patient presents with   Right Shoulder - Pain   Left Shoulder - Pain    HPI patient is a pleasant 52 year old gentleman who comes in  today with bilateral shoulder pain right greater than left that has progressively worsened over time.  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  The pain he has is to the entire aspect of both shoulders and radiates into the parascapular region.  He feels there is a constant crick in his neck and often feels weak in both arms.  His symptoms appear to be worse when he is sleeping on either side as well as with any overhead activity.  He does frequently wake up shaking his hands as they become numb and tingly.  He also has a sensation when he is driving greater than 2 to 3 hours at a time.  He has tried Flexeril and Mobic for 2 weeks without any relief.  No previous cortisone injection to his shoulders or neck.  Review of Systems as detailed in HPI.  All others reviewed and are negative.   Objective: Vital Signs: There were no vitals taken for this visit.  Physical Exam well-developed well-nourished gentleman in no acute distress.  Alert and oriented x 3.  Ortho Exam bilateral shoulder exam:  Right shoulder forward flexion to 90 degrees.  Limited abduction, external rotation and internal rotation for which she can get to his back pocket.  Marked pain and weakness with empty can testing.  Negative speeds and negative O'Brien's.  Mild tenderness to the [facility] joint.  Left shoulder shows forward flexion to approximately 160 degrees.  Positive to can test with full strength.  Internal rotation to L5.  Negative speeds and negative O'Brien's.  Mild tenderness to the [facility] joint.  Cervical spine exam shows increased pain with flexion, extension and rotation.  He does have paraspinous tenderness and tenderness of the right parascapular region.  Bilateral hand exam shows a positive Phalen and Tinel on the right.  Negative Phalen and Tinel on the left.  Specialty Comments:  No specialty comments available.  Imaging: XR Cervical Spine 2 or 3 views  Result Date:  06/28/2022 X-rays demonstrate advanced degenerative changes see 4 through C7 with anterior osteophyte formation    PMFS History: Patient Active Problem List   Diagnosis Date Noted   Cigar smoker 06/20/2022   Routine general medical examination at a health care facility 06/20/2022   Chronic pain of both shoulders 11/30/2021   Arthritis 06/18/2021   Community acquired pneumonia of right lung 04/28/2018   Tobacco abuse 04/28/2018   Past Medical History:  Diagnosis Date   Arthritis    Asthma    years ago, no longer requiring inhaler   Stroke (HCC)    TIA 83yrs ago, no residual symptoms  (?Bells Palsy)    Family History  Problem Relation Age of Onset   Cancer Mother        pancreatic   Diabetes Mother    Hypertension Mother    Heart disease Maternal Uncle    Colon cancer Neg Hx    Colon polyps Neg Hx    Esophageal cancer Neg Hx    Stomach cancer Neg Hx    Rectal cancer Neg Hx     History reviewed. No pertinent surgical history. Social History   Occupational History   Not on file  Tobacco Use   Smoking status: Some Days    Types: Cigars   Smokeless tobacco: Never  Vaping Use   Vaping Use: Never used  Substance and Sexual Activity   Alcohol use: Yes    Alcohol/week: 4.0 standard drinks of alcohol    Types: 4 Cans of beer per week    Comment: weekly   Drug use: Yes    Types: Marijuana   Sexual activity: Yes    Birth control/protection: None

## 2022-07-13 ENCOUNTER — Other Ambulatory Visit: Payer: Self-pay | Admitting: Nurse Practitioner

## 2022-07-15 NOTE — Telephone Encounter (Signed)
Refill request for  Meloxicam 15 mg LR  11/30/21, #30, 2 rf LOV 06/20/22 FOV   none scheduled.  Please review and advise.    Thanks. Dm/cma

## 2022-07-23 ENCOUNTER — Encounter: Payer: Self-pay | Admitting: Physical Medicine and Rehabilitation

## 2022-07-23 ENCOUNTER — Ambulatory Visit (INDEPENDENT_AMBULATORY_CARE_PROVIDER_SITE_OTHER): Payer: 59 | Admitting: Physical Medicine and Rehabilitation

## 2022-07-23 DIAGNOSIS — M25512 Pain in left shoulder: Secondary | ICD-10-CM

## 2022-07-23 DIAGNOSIS — G8929 Other chronic pain: Secondary | ICD-10-CM

## 2022-07-23 DIAGNOSIS — R531 Weakness: Secondary | ICD-10-CM

## 2022-07-23 DIAGNOSIS — R202 Paresthesia of skin: Secondary | ICD-10-CM | POA: Diagnosis not present

## 2022-07-23 DIAGNOSIS — M542 Cervicalgia: Secondary | ICD-10-CM | POA: Diagnosis not present

## 2022-07-23 DIAGNOSIS — M25511 Pain in right shoulder: Secondary | ICD-10-CM

## 2022-07-23 DIAGNOSIS — G894 Chronic pain syndrome: Secondary | ICD-10-CM

## 2022-07-23 NOTE — Progress Notes (Signed)
   [name] - 52 y.o. male MRN [id number]  Date of birth: Sep 03, 1970  Office Visit Note: Visit Date: 07/23/2022 PCP: Gerre Scull, NP Referred by: Cristie Hem, PA-C  Subjective: Chief Complaint  Patient presents with  . Right Hand - Pain  . Left Hand - Pain   HPI:  Victor Floyd is a 52 y.o. male who comes in todayHPI ROS Otherwise per HPI.  Assessment & Plan: Visit Diagnoses:    ICD-10-CM   1. Paresthesia of skin  R20.2 NCV with EMG (electromyography)    2. Cervicalgia  M54.2 NCV with EMG (electromyography)    3. Weakness  R53.1 NCV with EMG (electromyography)      Plan: No additional findings.   Meds & Orders: No orders of the defined types were placed in this encounter.   Orders Placed This Encounter  Procedures  . NCV with EMG (electromyography)    Follow-up: Return for Trenda Moots, PA-C.   Procedures: No procedures performed      Clinical History: MRI CERVICAL SPINE WITHOUT CONTRAST   TECHNIQUE: Multiplanar, multisequence MR imaging of the cervical spine was performed. No intravenous contrast was administered.   COMPARISON:  Cervical radiographs 02/21/2015   FINDINGS: Alignment: Normal alignment.  Mild kyphosis at C3-4   Vertebrae: Negative for fracture or mass.   Cord: Normal signal and morphology   Posterior Fossa, vertebral arteries, paraspinal tissues: normal paraspinous soft tissues. Brainstem normal. Mucosal edema paranasal sinuses.   Disc levels:   C2-3:  Negative   C3-4: Small central disc protrusion. Cord flattening with mild spinal stenosis. Neural foramina patent.   C4-5:  Negative   C5-6:  Negative   C6-7:  Negative   C7-T1:  Negative   IMPRESSION: Small central disc protrusion at C3-4 causing mild spinal stenosis. No significant foraminal narrowing. Other disc spaces appear normal.     Electronically Signed   By: Marlan Palau M.D.   On: 02/11/2016 12:52     Objective:  VS:  HT:    WT:    BMI:     BP:   HR: bpm  TEMP: ( )  RESP:  Physical Exam   Imaging: No results found.

## 2022-07-24 NOTE — Procedures (Signed)
EMG & NCV Findings: Evaluation of the left median (across palm) sensory and the right median (across palm) sensory nerves showed prolonged distal peak latency (Wrist, L4.2, R4.1 ms) and prolonged distal peak latency (Palm, L2.3, R2.5 ms).  The left ulnar sensory and the right ulnar sensory nerves showed prolonged distal peak latency (L4.3, R4.0 ms) and decreased conduction velocity (Wrist-5th Digit, L33, R35 m/s).  All remaining nerves (as indicated in the following tables) were within normal limits.  All left vs. right side differences were within normal limits.    All examined muscles (as indicated in the following table) showed no evidence of electrical instability.    Impression: The above electrodiagnostic study was technically somewhat hard to perform and interpret due to the patient having significant hyperhidrosis of both hands as well as cold temperature and he did use some Vaseline on his hands this morning but it is ABNORMAL and reveals evidence of:   a mild to moderate right median nerve entrapment at the wrist (carpal tunnel syndrome) affecting sensory components.   a mild left median nerve entrapment at the wrist (carpal tunnel syndrome) affecting sensory components.  There is no significant electrodiagnostic evidence of any other focal nerve entrapment, brachial plexopathy or cervical radiculopathy.    **As you know, this particular electrodiagnostic study cannot rule out chemical radiculitis or sensory only radiculopathy.  Careful clinical correlation is paramount as these findings likely would not explain the totality of his symptoms.  Recommendations: 1.  Follow-up with referring physician. 2.  Continue current management of symptoms.  Consider updating cervical MRI. 3.  Continue use of resting splint at night-time and as needed during the day.  ___________________________ Elease Hashimoto Board Certified, American Board of Physical Medicine and  Rehabilitation    Nerve Conduction Studies Anti Sensory Summary Table   Stim Site NR Peak (ms) Norm Peak (ms) P-T Amp (V) Norm P-T Amp Site1 Site2 Delta-P (ms) Dist (cm) Vel (m/s) Norm Vel (m/s)  Left Median Acr Palm Anti Sensory (2nd Digit)  30.6C  Wrist    *4.2 <3.6 56.5 >10 Wrist Palm 1.9 0.0    Palm    *2.3 <2.0 43.1         Right Median Acr Palm Anti Sensory (2nd Digit)  29.9C  Wrist    *4.1 <3.6 35.3 >10 Wrist Palm 1.6 0.0    Palm    *2.5 <2.0 25.7         Left Radial Anti Sensory (Base 1st Digit)  30C  Wrist    2.6 <3.1 49.6  Wrist Base 1st Digit 2.6 0.0    Right Radial Anti Sensory (Base 1st Digit)  29.9C  Wrist    2.1 <3.1 20.2  Wrist Base 1st Digit 2.1 0.0    Left Ulnar Anti Sensory (5th Digit)  30.8C  Wrist    *4.3 <3.7 32.1 >15.0 Wrist 5th Digit 4.3 14.0 *33 >38  Right Ulnar Anti Sensory (5th Digit)  30.6C  Wrist    *4.0 <3.7 17.6 >15.0 Wrist 5th Digit 4.0 14.0 *35 >38   Motor Summary Table   Stim Site NR Onset (ms) Norm Onset (ms) O-P Amp (mV) Norm O-P Amp Site1 Site2 Delta-0 (ms) Dist (cm) Vel (m/s) Norm Vel (m/s)  Left Median Motor (Abd Poll Brev)  29.3C  Wrist    3.9 <4.2 9.8 >5 Elbow Wrist 4.7 25.0 53 >50  Elbow    8.6  9.8         Right Median Motor (Abd [employer]  Brev)  29.8C  Wrist    4.2 <4.2 8.4 >5 Elbow Wrist 4.5 27.5 61 >50  Elbow    8.7  8.1         Left Ulnar Motor (Abd Dig Min)  30C  Wrist    3.4 <4.2 9.8 >3 B Elbow Wrist 4.1 23.0 56 >53  B Elbow    7.5  10.0  A Elbow B Elbow 1.3 10.0 77 >53  A Elbow    8.8  10.1         Right Ulnar Motor (Abd Dig Min)  29.8C  Wrist    3.7 <4.2 11.4 >3 B Elbow Wrist 4.2 23.0 55 >53  B Elbow    7.9  11.4  A Elbow B Elbow 1.2 10.0 83 >53  A Elbow    9.1  11.3          EMG   Side Muscle Nerve Root Ins Act Fibs Psw Amp Dur Poly Recrt Int Dennie Bible Comment  Right Abd Poll Brev Median C8-T1 Nml Nml Nml Nml Nml 0 Nml Nml   Right 1stDorInt Ulnar C8-T1 Nml Nml Nml Nml Nml 0 Nml Nml   Right PronatorTeres Median C6-7 Nml  Nml Nml Nml Nml 0 Nml Nml   Right Biceps Musculocut C5-6 Nml Nml Nml Nml Nml 0 Nml Nml   Right Deltoid Axillary C5-6 Nml Nml Nml Nml Nml 0 Nml Nml     Nerve Conduction Studies Anti Sensory Left/Right Comparison   Stim Site L Lat (ms) R Lat (ms) L-R Lat (ms) L Amp (V) R Amp (V) L-R Amp (%) Site1 Site2 L Vel (m/s) R Vel (m/s) L-R Vel (m/s)  Median Acr Palm Anti Sensory (2nd Digit)  30.6C  Wrist *4.2 *4.1 0.1 56.5 35.3 37.5 Wrist Palm     Palm *2.3 *2.5 0.2 43.1 25.7 40.4       Radial Anti Sensory (Base 1st Digit)  30C  Wrist 2.6 2.1 0.5 49.6 20.2 59.3 Wrist Base 1st Digit     Ulnar Anti Sensory (5th Digit)  30.8C  Wrist *4.3 *4.0 0.3 32.1 17.6 45.2 Wrist 5th Digit *33 *35 2   Motor Left/Right Comparison   Stim Site L Lat (ms) R Lat (ms) L-R Lat (ms) L Amp (mV) R Amp (mV) L-R Amp (%) Site1 Site2 L Vel (m/s) R Vel (m/s) L-R Vel (m/s)  Median Motor (Abd Poll Brev)  29.3C  Wrist 3.9 4.2 0.3 9.8 8.4 14.3 Elbow Wrist 53 61 8  Elbow 8.6 8.7 0.1 9.8 8.1 17.3       Ulnar Motor (Abd Dig Min)  30C  Wrist 3.4 3.7 0.3 9.8 11.4 14.0 B Elbow Wrist 56 55 1  B Elbow 7.5 7.9 0.4 10.0 11.4 12.3 A Elbow B Elbow 77 83 6  A Elbow 8.8 9.1 0.3 10.1 11.3 10.6          Waveforms:

## 2022-07-30 ENCOUNTER — Ambulatory Visit: Payer: 59 | Admitting: Physician Assistant

## 2022-08-06 ENCOUNTER — Ambulatory Visit: Payer: 59 | Admitting: Physician Assistant
